# Patient Record
Sex: Female | Born: 1985 | Race: Black or African American | Hispanic: No | Marital: Married | Smoking: Current every day smoker
Health system: Southern US, Community
[De-identification: ages and names within clinical notes are randomized; demographics above are authoritative.]

## PROBLEM LIST (undated history)

## (undated) ENCOUNTER — Inpatient Hospital Stay (HOSPITAL_COMMUNITY): Payer: Self-pay

## (undated) DIAGNOSIS — B009 Herpesviral infection, unspecified: Secondary | ICD-10-CM

## (undated) DIAGNOSIS — T8859XA Other complications of anesthesia, initial encounter: Secondary | ICD-10-CM

## (undated) DIAGNOSIS — T4145XA Adverse effect of unspecified anesthetic, initial encounter: Secondary | ICD-10-CM

## (undated) DIAGNOSIS — Z789 Other specified health status: Secondary | ICD-10-CM

## (undated) HISTORY — DX: Herpesviral infection, unspecified: B00.9

## (undated) HISTORY — DX: Other complications of anesthesia, initial encounter: T88.59XA

---
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
## 2011-06-16 ENCOUNTER — Ambulatory Visit (INDEPENDENT_AMBULATORY_CARE_PROVIDER_SITE_OTHER): Payer: BC Managed Care – PPO

## 2011-06-16 DIAGNOSIS — B9789 Other viral agents as the cause of diseases classified elsewhere: Secondary | ICD-10-CM

## 2011-06-16 DIAGNOSIS — J029 Acute pharyngitis, unspecified: Secondary | ICD-10-CM

## 2011-08-07 ENCOUNTER — Ambulatory Visit (INDEPENDENT_AMBULATORY_CARE_PROVIDER_SITE_OTHER): Payer: BC Managed Care – PPO | Admitting: Physician Assistant

## 2011-08-07 VITALS — BP 121/84 | HR 85 | Temp 98.2°F | Resp 18 | Ht 62.5 in | Wt 214.0 lb

## 2011-08-07 DIAGNOSIS — M545 Low back pain: Secondary | ICD-10-CM

## 2011-08-07 MED ORDER — MELOXICAM 15 MG PO TABS: 15.0000 mg | ORAL_TABLET | Freq: Every day | ORAL | Status: AC

## 2011-08-07 NOTE — Patient Instructions (Signed)
Take medication as directed.  Use "donut" when seated Return if symptoms worsen or no improvement 3 days.    Sciatica Sciatica is a weakness and/or changes in sensation (tingling, jolts, hot and cold, numbness) along the path the sciatic nerve travels. Irritation or damage to lumbar nerve roots is often also referred to as lumbar radiculopathy.  Lumbar radiculopathy (Sciatica) is the most common form of this problem. Radiculopathy can occur in any of the nerves coming out of the spinal cord. The problems caused depend on which nerves are involved. The sciatic nerve is the large nerve supplying the branches of nerves going from the hip to the toes. It often causes a numbness or weakness in the skin and/or muscles that the sciatic nerve serves. It also may cause symptoms (problems) of pain, burning, tingling, or electric shock-like feelings in the path of this nerve. This usually comes from injury to the fibers that make up the sciatic nerve. Some of these symptoms are low back pain and/or unpleasant feelings in the following areas:  From the mid-buttock down the back of the leg to the back of the knee.   And/or the outside of the calf and top of the foot.   And/or behind the inner ankle to the sole of the foot.  CAUSES   Herniated or slipped disc. Discs are the little cushions between the bones in the back.   Pressure by the piriformis muscle in the buttock on the sciatic nerve (Piriformis Syndrome).   Misalignment of the bones in the lower back and buttocks (Sacroiliac Joint Derangement).   Narrowing of the spinal canal that puts pressure on or pinches the fibers that make up the sciatic nerve.   A slipped vertebra that is out of line with those above or beneath it.   Abnormality of the nervous system itself so that nerve fibers do not transmit signals properly, especially to feet and calves (neuropathy).   Tumor (this is rare).  Your caregiver can usually determine the cause of your  sciatica and begin the treatment most likely to help you. TREATMENT  Taking over-the-counter painkillers, physical therapy, rest, exercise, spinal manipulation, and injections of anesthetics and/or steroids may be used. Surgery, acupuncture, and Yoga can also be effective. Mind over matter techniques, mental imagery, and changing factors such as your bed, chair, desk height, posture, and activities are other treatments that may be helpful. You and your caregiver can help determine what is best for you. With proper diagnosis, the cause of most sciatica can be identified and removed. Communication and cooperation between your caregiver and you is essential. If you are not successful immediately, do not be discouraged. With time, a proper treatment can be found that will make you comfortable. HOME CARE INSTRUCTIONS   If the pain is coming from a problem in the back, applying ice to that area for 15 to 20 minutes, 3 to 4 times per day while awake, may be helpful. Put the ice in a plastic bag. Place a towel between the bag of ice and your skin.   You may exercise or perform your usual activities if these do not aggravate your pain, or as suggested by your caregiver.   Only take over-the-counter or prescription medicines for pain, discomfort, or fever as directed by your caregiver.   If your caregiver has given you a follow-up appointment, it is very important to keep that appointment. Not keeping the appointment could result in a chronic or permanent injury, pain, and disability. If  there is any problem keeping the appointment, you must call back to this facility for assistance.  SEEK IMMEDIATE MEDICAL CARE IF:   You experience loss of control of bowel or bladder.   You have increasing weakness in the trunk, buttocks, or legs.   There is numbness in any areas from the hip down to the toes.   You have difficulty walking or keeping your balance.   You have any of the above, with fever or forceful  vomiting.  Document Released: 06/01/2001 Document Revised: 02/17/2011 Document Reviewed: 01/19/2008 [facility] Patient Information 2012 [place], [place].

## 2011-08-07 NOTE — Progress Notes (Signed)
  Subjective:    Patient ID: Kayla [name], female    DOB: 1985/10/19, 26 y.o.   MRN: [id number]  HPI Kayla [name] presents with low back pain, left leg pain and tingling last night while walking at work. Symptoms radiated from low back to foot. No injury. Works 12 hr shifts and walks most of the shift. No recent prolonged sitting.  Took at [facility] with relief.  Pain only present today back of left thigh. No tingling. No history of these symptoms in past.     Review of Systems  Constitutional: Negative.   Musculoskeletal: Positive for back pain.  Neurological: Positive for numbness. Negative for weakness.       Tingling       Objective:   Physical Exam  Constitutional: She appears well-developed and well-nourished.  Musculoskeletal:       Lumbar back: She exhibits pain. She exhibits normal range of motion, no tenderness and no spasm.       Back:  Neurological:  Reflex Scores:      Patellar reflexes are 1+ on the right side and 1+ on the left side.      Achilles reflexes are 1+ on the right side and 1+ on the left side.      Negative straight leg raise          Assessment & Plan:  Low back pain Paraesthesias Sciatic nerve irritation  Mobic 15 mg qd. Heat to area of pain. Donut for comfort while seated. Pt request out of work note for 2 days. Return if no improvement 3 days.

## 2011-08-10 ENCOUNTER — Emergency Department (INDEPENDENT_AMBULATORY_CARE_PROVIDER_SITE_OTHER)
Admission: EM | Admit: 2011-08-10 | Discharge: 2011-08-10 | Disposition: A | Discharge status: Home Health | Payer: BC Managed Care – PPO | Source: Home / Self Care | Attending: Emergency Medicine | Admitting: Emergency Medicine

## 2011-08-10 ENCOUNTER — Encounter (HOSPITAL_COMMUNITY): Payer: Self-pay | Admitting: Emergency Medicine

## 2011-08-10 DIAGNOSIS — J069 Acute upper respiratory infection, unspecified: Secondary | ICD-10-CM

## 2011-08-10 DIAGNOSIS — R05 Cough: Secondary | ICD-10-CM

## 2011-08-10 MED ORDER — GUAIFENESIN-CODEINE 100-10 MG/5ML PO SYRP: 5.0000 mL | ORAL_SOLUTION | Freq: Three times a day (TID) | ORAL | Status: AC | PRN

## 2011-08-10 MED ORDER — ALBUTEROL SULFATE HFA 108 (90 BASE) MCG/ACT IN AERS: 1.0000 | INHALATION_SPRAY | Freq: Four times a day (QID) | RESPIRATORY_TRACT | Status: DC | PRN

## 2011-08-10 NOTE — ED Provider Notes (Signed)
History     CSN: [id number]  Arrival date & time 08/10/11  4098   First MD Initiated Contact with Patient 08/10/11 [phone]      Chief Complaint  Patient presents with  . Bronchitis  . Cough    (Consider location/radiation/quality/duration/timing/severity/associated sxs/prior treatment) HPI Comments: Patient presents urgent care complaining of a barking cough mainly dry but occasional clear phlegm body aches and chest congestion. "It hurts when I cough feels like a burning sensation in my chest" been taking some Tylenol. Think I might have had a fever last night not certain.  Patient is a 26 y.o. female presenting with cough. The history is provided by the patient.  Cough This is a new problem. The current episode started 2 days ago. The problem occurs every few minutes. The problem has been gradually worsening. The cough is non-productive. The maximum temperature recorded prior to her arrival was 100 to 100.9 F. Associated symptoms include chills, rhinorrhea, sore throat and wheezing. Pertinent negatives include no chest pain, no headaches and no shortness of breath. She has tried decongestants for the symptoms. She is a smoker. Her past medical history does not include pneumonia or asthma.    History reviewed. No pertinent past medical history.  History reviewed. No pertinent past surgical history.  No family history on file.  History  Substance Use Topics  . Smoking status: Current Some Day Smoker  . Smokeless tobacco: Not on file  . Alcohol Use: Yes    OB History    Grav Para Term Preterm Abortions TAB SAB Ect Mult Living                  Review of Systems  Constitutional: Positive for fever, chills and appetite change.  HENT: Positive for sore throat and rhinorrhea.   Respiratory: Positive for cough, chest tightness and wheezing. Negative for shortness of breath.   Cardiovascular: Negative for chest pain and palpitations.  Musculoskeletal: Positive for arthralgias.    Skin: Negative for rash.  Neurological: Negative for weakness and headaches.    Allergies  Review of patient's allergies indicates no known allergies.  Home Medications   Current Outpatient Rx  Name Route Sig Dispense Refill  . ALBUTEROL SULFATE HFA 108 (90 BASE) MCG/ACT IN AERS Inhalation Inhale 1-2 puffs into the lungs every 6 (six) hours as needed for wheezing. 1 Inhaler 0  . GUAIFENESIN-CODEINE 100-10 MG/5ML PO SYRP Oral Take 5 mLs by mouth 3 (three) times daily as needed for cough. 120 mL 0  . MELOXICAM 15 MG PO TABS Oral Take 1 tablet (15 mg total) by mouth daily. 30 tablet 0    BP 132/82  Pulse 106  Temp(Src) 98.9 F (37.2 C) (Oral)  Resp 18  SpO2 96%  LMP 07/19/2011  Physical Exam  Nursing note and vitals reviewed. Constitutional: She appears well-developed. She is active.  Non-toxic appearance. She does not have a sickly appearance. She does not appear ill. No distress.  HENT:  Head: Normocephalic.  Right Ear: Tympanic membrane normal.  Left Ear: Tympanic membrane normal.  Mouth/Throat: Uvula is midline and mucous membranes are normal. Posterior oropharyngeal erythema present. No tonsillar abscesses.  Eyes: Conjunctivae are normal.  Neck: Normal range of motion. Neck supple.  Pulmonary/Chest: Effort normal. No respiratory distress. She has no decreased breath sounds. She has wheezes. She has no rhonchi. She has no rales.    Abdominal: Soft.  Neurological: She is alert.  Skin: Skin is warm.    ED Course  Procedures (  including critical care time)  Labs Reviewed - No data to display No results found.   1. Upper respiratory infection   2. Cough       MDM  Upper Torri infection with mild reactive airway disease symptomatic treatment.        Jimmie Molly, MD 08/10/11 [phone]

## 2011-08-10 NOTE — ED Notes (Signed)
HERE WITH CONSTANT BARKING COUGH AND CLEAR PHLEGM,BODY ACHES AND CHEST CONGESTION AND PAIN THAT STARTED YESTERDAY MORNING.PT [facility] BEEN TAKING OTC TYLENOL COLD BUT NO RELIEF.NO FEVER,N,V.

## 2011-08-10 NOTE — Discharge Instructions (Signed)
Antibiotic Nonuse  Your caregiver felt that the infection or problem was not one that would be helped with an antibiotic. Infections may be caused by viruses or bacteria. Only a caregiver can tell which one of these is the likely cause of an illness. A cold is the most common cause of infection in both adults and children. A cold is a virus. Antibiotic treatment will have no effect on a viral infection. Viruses can lead to many lost days of work caring for sick children and many missed days of school. Children may catch as many as 10 "colds" or "flus" per year during which they can be tearful, cranky, and uncomfortable. The goal of treating a virus is aimed at keeping the ill person comfortable. Antibiotics are medications used to help the body fight bacterial infections. There are relatively few types of bacteria that cause infections but there are hundreds of viruses. While both viruses and bacteria cause infection they are very different types of germs. A viral infection will typically go away by itself within 7 to 10 days. Bacterial infections may spread or get worse without antibiotic treatment. Examples of bacterial infections are:  Sore throats (like strep throat or tonsillitis).     Infection in the lung (pneumonia).     Ear and skin infections.  Examples of viral infections are:  Colds or flus.     Most coughs and bronchitis.     Sore throats not caused by Strep.     Runny noses.  It is often best not to take an antibiotic when a viral infection is the cause of the problem. Antibiotics can kill off the helpful bacteria that we have inside our body and allow harmful bacteria to start growing. Antibiotics can cause side effects such as allergies, nausea, and diarrhea without helping to improve the symptoms of the viral infection. Additionally, repeated uses of antibiotics can cause bacteria inside of our body to become resistant. That resistance can be passed onto harmful bacterial. The  next time you have an infection it may be harder to treat if antibiotics are used when they are not needed. Not treating with antibiotics allows our own immune system to develop and take care of infections more efficiently. Also, antibiotics will work better for us when they are prescribed for bacterial infections. Treatments for a child that is ill may include:  Give extra fluids throughout the day to stay hydrated.     Get plenty of rest.     Only give your child over-the-counter or prescription medicines for pain, discomfort, or fever as directed by your caregiver.     The use of a cool mist humidifier may help stuffy noses.     Cold medications if suggested by your caregiver.  Your caregiver may decide to start you on an antibiotic if:  The problem you were seen for today continues for a longer length of time than expected.     You develop a secondary bacterial infection.  SEEK MEDICAL CARE IF:  Fever lasts longer than 5 days.     Symptoms continue to get worse after 5 to 7 days or become severe.     Difficulty in breathing develops.     Signs of dehydration develop (poor drinking, rare urinating, dark colored urine).     Changes in behavior or worsening tiredness (listlessness or lethargy).  Document Released: 08/16/2001 Document Revised: 02/17/2011 Document Reviewed: 02/12/2009 [employer] Patient Information 2012 [employer]. 

## 2014-09-18 ENCOUNTER — Ambulatory Visit: Payer: BC Managed Care – PPO | Attending: Internal Medicine

## 2015-04-24 ENCOUNTER — Encounter: Payer: Self-pay | Admitting: Family Medicine

## 2015-04-24 ENCOUNTER — Ambulatory Visit (INDEPENDENT_AMBULATORY_CARE_PROVIDER_SITE_OTHER): Payer: BLUE CROSS/BLUE SHIELD | Admitting: Family Medicine

## 2015-04-24 VITALS — BP 110/80 | HR 92 | Temp 98.9°F | Resp 16 | Ht 62.5 in | Wt 230.2 lb

## 2015-04-24 DIAGNOSIS — Z0000 Encounter for general adult medical examination without abnormal findings: Secondary | ICD-10-CM

## 2015-04-24 DIAGNOSIS — Z1321 Encounter for screening for nutritional disorder: Secondary | ICD-10-CM

## 2015-04-24 DIAGNOSIS — Z136 Encounter for screening for cardiovascular disorders: Secondary | ICD-10-CM

## 2015-04-24 DIAGNOSIS — E559 Vitamin D deficiency, unspecified: Secondary | ICD-10-CM | POA: Diagnosis not present

## 2015-04-24 DIAGNOSIS — Z1389 Encounter for screening for other disorder: Secondary | ICD-10-CM | POA: Diagnosis not present

## 2015-04-24 DIAGNOSIS — Z130 Encounter for screening for diseases of the blood and blood-forming organs and certain disorders involving the immune mechanism: Secondary | ICD-10-CM

## 2015-04-24 DIAGNOSIS — Z1383 Encounter for screening for respiratory disorder NEC: Secondary | ICD-10-CM

## 2015-04-24 DIAGNOSIS — B009 Herpesviral infection, unspecified: Secondary | ICD-10-CM

## 2015-04-24 DIAGNOSIS — Z1329 Encounter for screening for other suspected endocrine disorder: Secondary | ICD-10-CM | POA: Diagnosis not present

## 2015-04-24 DIAGNOSIS — Z113 Encounter for screening for infections with a predominantly sexual mode of transmission: Secondary | ICD-10-CM

## 2015-04-24 LAB — CBC
HEMATOCRIT: 39.5 % (ref 36.0–46.0)
HEMOGLOBIN: 13.8 g/dL (ref 12.0–15.0)
MCH: 27.5 pg (ref 26.0–34.0)
MCHC: 34.9 g/dL (ref 30.0–36.0)
MCV: 78.7 fL (ref 78.0–100.0)
MPV: 8.8 fL (ref 8.6–12.4)
PLATELETS: 297 10*3/uL (ref 150–400)
RBC: 5.02 MIL/uL (ref 3.87–5.11)
RDW: 14.8 % (ref 11.5–15.5)
WBC: 5.8 10*3/uL (ref 4.0–10.5)

## 2015-04-24 LAB — LIPID PANEL
CHOL/HDL RATIO: 2.5 ratio (ref <=5.0)
CHOLESTEROL: 186 mg/dL (ref 125–200)
HDL: 75 mg/dL (ref >=46)
LDL Cholesterol: 94 mg/dL (ref <130)
TRIGLYCERIDES: 86 mg/dL (ref <150)
VLDL: 17 mg/dL (ref <30)

## 2015-04-24 LAB — COMPREHENSIVE METABOLIC PANEL
ALBUMIN: 4.5 g/dL (ref 3.6–5.1)
ALK PHOS: 49 U/L (ref 33–115)
ALT: 15 U/L (ref 6–29)
AST: 18 U/L (ref 10–30)
BUN: 11 mg/dL (ref 7–25)
CALCIUM: 9.4 mg/dL (ref 8.6–10.2)
CHLORIDE: 106 mmol/L (ref 98–110)
CO2: 22 mmol/L (ref 20–31)
Creat: 0.67 mg/dL (ref 0.50–1.10)
Glucose, Bld: 84 mg/dL (ref 65–99)
POTASSIUM: 4.4 mmol/L (ref 3.5–5.3)
Sodium: 135 mmol/L (ref 135–146)
TOTAL PROTEIN: 7 g/dL (ref 6.1–8.1)
Total Bilirubin: 0.5 mg/dL (ref 0.2–1.2)

## 2015-04-24 LAB — TSH: TSH: 1.291 u[IU]/mL (ref 0.350–4.500)

## 2015-04-24 NOTE — Progress Notes (Signed)
Subjective:    Patient ID: Kayla [name], female    DOB: 08/22/1985, 29 y.o.   MRN: [id number] Chief Complaint  Patient presents with  . Annual Exam    W/O PAP, Gyn appt next week 05/02/15    HPI  G2P0020.  Had elective abortion x 2.  She is in a lnog-term relationship for 5 yrs and not wanting.  Her cycles are every 25 days, no dysmenorrhea or menorrheagia.  Has not been on birth control prior. Uses condoms occ  Is smoking with no desire to quit at this time.  Is not taking vit - will start pnv.   Exercise intermittently but giong to restart - she would like to loose 30 to 60 lbs. She has never lost a sig amount of weight before but has never tried.  She did loose 15 lbs as she was drinking a lot of water, not eating at night, healthy breakfast.  Gets lots of water.  Maternal GF diagnosed and some other in family.   Last pap was 2014 and [facility]  History reviewed. No pertinent past medical history. History reviewed. No pertinent past surgical history. Current Outpatient Prescriptions on File Prior to Visit  Medication Sig Dispense Refill  . albuterol (PROVENTIL HFA;VENTOLIN HFA) 108 (90 BASE) MCG/ACT inhaler Inhale 1-2 puffs into the lungs every 6 (six) hours as needed for wheezing. 1 Inhaler 0   No current facility-administered medications on file prior to visit.   No Known Allergies Family History  Problem Relation Age of Onset  . Cancer Maternal Grandmother     leukemia  . Diabetes Maternal Grandmother   . Hyperlipidemia Maternal Grandfather   . Heart disease Paternal Grandmother    Social History   Social History  . Marital Status: Single    Spouse Name: N/A  . Number of Children: N/A  . Years of Education: N/A   Occupational History  . Customer Care Analyst    Social History Main Topics  . Smoking status: Current Some Day Smoker  . Smokeless tobacco: None  . Alcohol Use: 1.8 oz/week    3 Standard drinks or equivalent per week    . Drug Use: No  . Sexual Activity: Yes   Other Topics Concern  . None   Social History Narrative   Single. Education: [employer]. Exercise: Yes.     Review of Systems  Constitutional: Negative.   HENT: Positive for dental problem.   Eyes: Negative.   Respiratory: Negative.   Cardiovascular: Negative.   Gastrointestinal: Negative.   Endocrine: Negative.   Genitourinary: Negative.   Musculoskeletal: Negative.   Skin: Negative.   Allergic/Immunologic: Negative.   Neurological: Negative.   Hematological: Negative.   Psychiatric/Behavioral: Negative.        Objective:  BP 110/80 mmHg  Pulse 92  Temp(Src) 98.9 F (37.2 C) (Oral)  Resp 16  Ht 5' 2.5" (1.588 m)  Wt 230 lb 3.2 oz (104.418 kg)  BMI 41.41 kg/m2  SpO2 97%  LMP 04/03/2015  Physical Exam  Constitutional: She is oriented to person, place, and time. She appears well-developed and well-nourished. No distress.  HENT:  Head: Normocephalic and atraumatic.  Right Ear: Tympanic membrane, external ear and ear canal normal.  Left Ear: Tympanic membrane, external ear and ear canal normal.  Nose: Nose normal. No mucosal edema or rhinorrhea.  Mouth/Throat: Uvula is midline, oropharynx is clear and moist and mucous membranes are normal. No posterior oropharyngeal erythema.  Eyes: Conjunctivae  and EOM are normal. Pupils are equal, round, and reactive to light. Right eye exhibits no discharge. Left eye exhibits no discharge. No scleral icterus.  Neck: Normal range of motion. Neck supple. No thyromegaly present.  Cardiovascular: Normal rate, regular rhythm, normal heart sounds and intact distal pulses.   Pulmonary/Chest: Effort normal and breath sounds normal. No respiratory distress.  Abdominal: Soft. Bowel sounds are normal. There is no tenderness.  Musculoskeletal: She exhibits no edema.  Lymphadenopathy:    She has no cervical adenopathy.  Neurological: She is alert and oriented to person, place, and time. She has normal  reflexes.  Skin: Skin is warm and dry. She is not diaphoretic. No erythema.  Psychiatric: She has a normal mood and affect. Her behavior is normal.          Assessment & Plan:   1. Annual physical exam   2. Routine screening for STI (sexually transmitted infection)   3. Screening for cardiovascular, respiratory, and genitourinary diseases   4. Screening for deficiency anemia   5. Screening for thyroid disorder   6. Encounter for vitamin deficiency screening   7. Herpes simplex infection   8. Vitamin D deficiency     Orders Placed This Encounter  Procedures  . CBC  . Comprehensive metabolic panel    Order Specific Question:  Has the patient fasted?    Answer:  Yes  . Lipid panel    Order Specific Question:  Has the patient fasted?    Answer:  Yes  . Vit D  25 hydroxy (rtn osteoporosis monitoring)  . TSH    Meds ordered this encounter  Medications  . Vitamin D, Ergocalciferol, (DRISDOL) 50000 UNITS CAPS capsule    Sig: Take 1 capsule (50,000 Units total) by mouth every 7 (seven) days.    Dispense:  12 capsule    Refill:  1     Norberto Sorenson, MD MPH

## 2015-04-25 LAB — VITAMIN D 25 HYDROXY (VIT D DEFICIENCY, FRACTURES): Vit D, 25-Hydroxy: 12 ng/mL — ABNORMAL LOW (ref 30–100)

## 2015-04-26 ENCOUNTER — Encounter: Payer: Self-pay | Admitting: Family Medicine

## 2015-04-26 MED ORDER — VITAMIN D (ERGOCALCIFEROL) 1.25 MG (50000 UNIT) PO CAPS: 50000.0000 [IU] | ORAL_CAPSULE | ORAL | Status: DC

## 2015-04-28 ENCOUNTER — Other Ambulatory Visit: Payer: Self-pay | Admitting: Obstetrics

## 2015-04-29 LAB — CYTOLOGY - PAP

## 2015-05-24 MED ORDER — VALACYCLOVIR HCL 1 G PO TABS: 1000.0000 mg | ORAL_TABLET | Freq: Two times a day (BID) | ORAL | Status: DC

## 2015-05-24 MED ORDER — PRENATAL PLUS 27-1 MG PO TABS: 1.0000 | ORAL_TABLET | Freq: Every day | ORAL | Status: DC

## 2016-03-25 ENCOUNTER — Inpatient Hospital Stay (HOSPITAL_COMMUNITY)
Admission: AD | Admit: 2016-03-25 | Discharge: 2016-03-25 | Disposition: A | Discharge status: Home / Self Care | Payer: 59 | Source: Ambulatory Visit | Attending: Family Medicine | Admitting: Family Medicine

## 2016-03-25 ENCOUNTER — Encounter (HOSPITAL_COMMUNITY): Payer: Self-pay | Admitting: *Deleted

## 2016-03-25 ENCOUNTER — Inpatient Hospital Stay (HOSPITAL_COMMUNITY): Payer: 59

## 2016-03-25 DIAGNOSIS — Z3A01 Less than 8 weeks gestation of pregnancy: Secondary | ICD-10-CM | POA: Diagnosis not present

## 2016-03-25 DIAGNOSIS — R1030 Lower abdominal pain, unspecified: Secondary | ICD-10-CM | POA: Diagnosis not present

## 2016-03-25 DIAGNOSIS — Z3490 Encounter for supervision of normal pregnancy, unspecified, unspecified trimester: Secondary | ICD-10-CM

## 2016-03-25 DIAGNOSIS — O26899 Other specified pregnancy related conditions, unspecified trimester: Secondary | ICD-10-CM

## 2016-03-25 DIAGNOSIS — O26891 Other specified pregnancy related conditions, first trimester: Secondary | ICD-10-CM | POA: Diagnosis not present

## 2016-03-25 DIAGNOSIS — R109 Unspecified abdominal pain: Secondary | ICD-10-CM | POA: Diagnosis not present

## 2016-03-25 DIAGNOSIS — Z87891 Personal history of nicotine dependence: Secondary | ICD-10-CM | POA: Diagnosis not present

## 2016-03-25 LAB — URINALYSIS, ROUTINE W REFLEX MICROSCOPIC
BILIRUBIN URINE: NEGATIVE
Glucose, UA: NEGATIVE mg/dL
Hgb urine dipstick: NEGATIVE
KETONES UR: 15 mg/dL — AB
Leukocytes, UA: NEGATIVE
NITRITE: NEGATIVE
PROTEIN: NEGATIVE mg/dL
SPECIFIC GRAVITY, URINE: 1.025 (ref 1.005–1.030)
pH: 6.5 (ref 5.0–8.0)

## 2016-03-25 LAB — CBC
HEMATOCRIT: 34.5 % — AB (ref 36.0–46.0)
HEMOGLOBIN: 12.3 g/dL (ref 12.0–15.0)
MCH: 27.3 pg (ref 26.0–34.0)
MCHC: 35.7 g/dL (ref 30.0–36.0)
MCV: 76.5 fL — AB (ref 78.0–100.0)
Platelets: 339 10*3/uL (ref 150–400)
RBC: 4.51 MIL/uL (ref 3.87–5.11)
RDW: 13.9 % (ref 11.5–15.5)
WBC: 8 10*3/uL (ref 4.0–10.5)

## 2016-03-25 LAB — WET PREP, GENITAL
Clue Cells Wet Prep HPF POC: NONE SEEN
SPERM: NONE SEEN
TRICH WET PREP: NONE SEEN

## 2016-03-25 LAB — HCG, QUANTITATIVE, PREGNANCY: hCG, Beta Chain, Quant, S: 70927 m[IU]/mL — ABNORMAL HIGH (ref <5)

## 2016-03-25 LAB — POCT PREGNANCY, URINE: Preg Test, Ur: POSITIVE — AB

## 2016-03-25 LAB — OB RESULTS CONSOLE GC/CHLAMYDIA: Gonorrhea: NEGATIVE

## 2016-03-25 NOTE — MAU Note (Signed)
Pt has had lower pain on and off x 2 weeks. Took HPT and it was positive. Denies any vaginal  Bleeding or discharge. C/o breast tenderness as well.

## 2016-03-25 NOTE — Discharge Instructions (Signed)
Prenatal Care Providers °[facility]    [facility] ° Phone- [phone]     Phone: [phone] °         °[facility]                      [facility] ° @[place]     Phone: [phone] ° Phone: [phone] °        [facility] °[facility]     Phone: [phone] ° Phone: [phone]   °        [facility] °[facility] [place]                hone: [phone] ° Phone: [phone] °        [facility] °Dr. Bernard Marshall      Phone: [phone] ° Phone: [phone] °        [facility] °[facility]                Phone: [phone] ° Women’s Health  ° Phone:[phone]    [facility] °         Phone: [phone] °[facility] °  Phone: [phone] °Safe Medications in Pregnancy  ° °Acne: °Benzoyl Peroxide °Salicylic Acid ° °Backache/Headache: °Tylenol: 2 regular strength every 4 hours OR °             2 Extra strength every 6 hours ° °Colds/Coughs/Allergies: °Benadryl (alcohol free) 25 mg every 6 hours as needed °Breath right strips °Claritin °Cepacol throat lozenges °Chloraseptic throat spray °Cold-Eeze- up to three times per day °Cough drops, alcohol free °Flonase (by prescription only) °Guaifenesin °Mucinex °Robitussin DM (plain only, alcohol free) °Saline nasal spray/drops °Sudafed (pseudoephedrine) & Actifed ** use only after [redacted] weeks gestation and if you do not have high blood pressure °Tylenol °Vicks Vaporub °Zinc lozenges °Zyrtec  ° °Constipation: °Colace °Ducolax suppositories °Fleet enema °Glycerin suppositories °Metamucil °Milk of magnesia °Miralax °Senokot °Smooth move tea ° °Diarrhea: °Kaopectate °Imodium A-D ° °*NO pepto Bismol ° °Hemorrhoids: °Anusol °Anusol HC °Preparation H °Tucks ° °Indigestion: °Tums °Maalox °Mylanta °Zantac  °Pepcid ° °Insomnia: °Benadryl (alcohol free) 25mg every 6 hours as needed °Tylenol  PM °Unisom, no Gelcaps ° °Leg Cramps: °Tums °MagGel ° °Nausea/Vomiting:  °Bonine °Dramamine °Emetrol °Ginger extract °Sea bands °Meclizine  °Nausea medication to take during pregnancy:  °Unisom (doxylamine succinate 25 mg tablets) Take one tablet daily at bedtime. If symptoms are not adequately controlled, the dose can be increased to a maximum recommended dose of two tablets daily (1/2 tablet in the morning, 1/2 tablet mid-afternoon and one at bedtime). °Vitamin B6 100mg tablets. Take one tablet twice a day (up to 200 mg per day). ° °Skin Rashes: °Aveeno products °Benadryl cream or 25mg every 6 hours as needed °Calamine Lotion °1% cortisone cream ° °Yeast infection: °Gyne-lotrimin 7 °Monistat 7 ° ° °**If taking multiple medications, please check labels to avoid duplicating the same active ingredients °**take medication as directed on the label °** Do not exceed 4000 mg of tylenol in 24 hours °**Do not take medications that contain aspirin or ibuprofen ° ° ° ° °First Trimester of Pregnancy °The first trimester of pregnancy is from week 1 until the end of week 12 (months 1 through 3). A week after a sperm fertilizes an egg, the egg will implant on the wall of the uterus. This   embryo will begin to develop into a baby. Genes from you and your partner are forming the baby. The female genes determine whether the baby is a boy or a girl. At 6-8 weeks, the eyes and face are formed, and the heartbeat can be seen on ultrasound. At the end of 12 weeks, all the baby's organs are formed.  °Now that you are pregnant, you will want to do everything you can to have a healthy baby. Two of the most important things are to get good prenatal care and to follow your health care provider's instructions. Prenatal care is all the medical care you receive before the baby's birth. This care will help prevent, find, and treat any problems during the pregnancy and childbirth. °BODY CHANGES °Your body goes through many changes during pregnancy.  The changes vary from woman to woman.  °· You may gain or lose a couple of pounds at first. °· You may feel sick to your stomach (nauseous) and throw up (vomit). If the vomiting is uncontrollable, call your health care provider. °· You may tire easily. °· You may develop headaches that can be relieved by medicines approved by your health care provider. °· You may urinate more often. Painful urination may mean you have a bladder infection. °· You may develop heartburn as a result of your pregnancy. °· You may develop constipation because certain hormones are causing the muscles that push waste through your intestines to slow down. °· You may develop hemorrhoids or swollen, bulging veins (varicose veins). °· Your breasts may begin to grow larger and become tender. Your nipples may stick out more, and the tissue that surrounds them (areola) may become darker. °· Your gums may bleed and may be sensitive to brushing and flossing. °· Dark spots or blotches (chloasma, mask of pregnancy) may develop on your face. This will likely fade after the baby is born. °· Your menstrual periods will stop. °· You may have a loss of appetite. °· You may develop cravings for certain kinds of food. °· You may have changes in your emotions from day to day, such as being excited to be pregnant or being concerned that something may go wrong with the pregnancy and baby. °· You may have more vivid and strange dreams. °· You may have changes in your hair. These can include thickening of your hair, rapid growth, and changes in texture. Some women also have hair loss during or after pregnancy, or hair that feels dry or thin. Your hair will most likely return to normal after your baby is born. °WHAT TO EXPECT AT YOUR PRENATAL VISITS °During a routine prenatal visit: °· You will be weighed to make sure you and the baby are growing normally. °· Your blood pressure will be taken. °· Your abdomen will be measured to track your baby's growth. °· The  fetal heartbeat will be listened to starting around week 10 or 12 of your pregnancy. °· Test results from any previous visits will be discussed. °Your health care provider may ask you: °· How you are feeling. °· If you are feeling the baby move. °· If you have had any abnormal symptoms, such as leaking fluid, bleeding, severe headaches, or abdominal cramping. °· If you are using any tobacco products, including cigarettes, chewing tobacco, and electronic cigarettes. °· If you have any questions. °Other tests that may be performed during your first trimester include: °· Blood tests to find your blood type and to check for the presence of any previous   infections. They will also be used to check for low iron levels (anemia) and Rh antibodies. Later in the pregnancy, blood tests for diabetes will be done along with other tests if problems develop. °· Urine tests to check for infections, diabetes, or protein in the urine. °· An ultrasound to confirm the proper growth and development of the baby. °· An amniocentesis to check for possible genetic problems. °· Fetal screens for spina bifida and Down syndrome. °· You may need other tests to make sure you and the baby are doing well. °· HIV (human immunodeficiency virus) testing. Routine prenatal testing includes screening for HIV, unless you choose not to have this test. °HOME CARE INSTRUCTIONS  °Medicines °· Follow your health care provider's instructions regarding medicine use. Specific medicines may be either safe or unsafe to take during pregnancy. °· Take your prenatal vitamins as directed. °· If you develop constipation, try taking a stool softener if your health care provider approves. °Diet °· Eat regular, well-balanced meals. Choose a variety of foods, such as meat or vegetable-based protein, fish, milk and low-fat dairy products, vegetables, fruits, and whole grain breads and cereals. Your health care provider will help you determine the amount of weight gain that  is right for you. °· Avoid raw meat and uncooked cheese. These carry germs that can cause birth defects in the baby. °· Eating four or five small meals rather than three large meals a day may help relieve nausea and vomiting. If you start to feel nauseous, eating a few soda crackers can be helpful. Drinking liquids between meals instead of during meals also seems to help nausea and vomiting. °· If you develop constipation, eat more high-fiber foods, such as fresh vegetables or fruit and whole grains. Drink enough fluids to keep your urine clear or pale yellow. °Activity and Exercise °· Exercise only as directed by your health care provider. Exercising will help you: °¨ Control your weight. °¨ Stay in shape. °¨ Be prepared for labor and delivery. °· Experiencing pain or cramping in the lower abdomen or low back is a good sign that you should stop exercising. Check with your health care provider before continuing normal exercises. °· Try to avoid standing for long periods of time. Move your legs often if you must stand in one place for a long time. °· Avoid heavy lifting. °· Wear low-heeled shoes, and practice good posture. °· You may continue to have sex unless your health care provider directs you otherwise. °Relief of Pain or Discomfort °· Wear a good support bra for breast tenderness.   °· Take warm sitz baths to soothe any pain or discomfort caused by hemorrhoids. Use hemorrhoid cream if your health care provider approves.   °· Rest with your legs elevated if you have leg cramps or low back pain. °· If you develop varicose veins in your legs, wear support hose. Elevate your feet for 15 minutes, 3-4 times a day. Limit salt in your diet. °Prenatal Care °· Schedule your prenatal visits by the twelfth week of pregnancy. They are usually scheduled monthly at first, then more often in the last 2 months before delivery. °· Write down your questions. Take them to your prenatal visits. °· Keep all your prenatal visits as  directed by your health care provider. °Safety °· Wear your seat belt at all times when driving. °· Make a list of emergency phone numbers, including numbers for family, friends, the hospital, and police and fire departments. °General Tips °· Ask your health care   provider for a referral to a local prenatal education class. Begin classes no later than at the beginning of month 6 of your pregnancy. °· Ask for help if you have counseling or nutritional needs during pregnancy. Your health care provider can offer advice or refer you to specialists for help with various needs. °· Do not use hot tubs, steam rooms, or saunas. °· Do not douche or use tampons or scented sanitary pads. °· Do not cross your legs for long periods of time. °· Avoid cat litter boxes and soil used by cats. These carry germs that can cause birth defects in the baby and possibly loss of the fetus by miscarriage or stillbirth. °· Avoid all smoking, herbs, alcohol, and medicines not prescribed by your health care provider. Chemicals in these affect the formation and growth of the baby. °· Do not use any tobacco products, including cigarettes, chewing tobacco, and electronic cigarettes. If you need help quitting, ask your health care provider. You may receive counseling support and other resources to help you quit. °· Schedule a dentist appointment. At home, brush your teeth with a soft toothbrush and be gentle when you floss. °SEEK MEDICAL CARE IF:  °· You have dizziness. °· You have mild pelvic cramps, pelvic pressure, or nagging pain in the abdominal area. °· You have persistent nausea, vomiting, or diarrhea. °· You have a bad smelling vaginal discharge. °· You have pain with urination. °· You notice increased swelling in your face, hands, legs, or ankles. °SEEK IMMEDIATE MEDICAL CARE IF:  °· You have a fever. °· You are leaking fluid from your vagina. °· You have spotting or bleeding from your vagina. °· You have severe abdominal cramping or  pain. °· You have rapid weight gain or loss. °· You vomit blood or material that looks like coffee grounds. °· You are exposed to German measles and have never had them. °· You are exposed to fifth disease or chickenpox. °· You develop a severe headache. °· You have shortness of breath. °· You have any kind of trauma, such as from a fall or a car accident. °  °This information is not intended to replace advice given to you by your health care provider. Make sure you discuss any questions you have with your health care provider. °  °Document Released: 06/01/2001 Document Revised: 06/28/2014 Document Reviewed: 04/17/2013 °Elsevier Interactive Patient Education ©2016 [employer] ° °

## 2016-03-25 NOTE — MAU Provider Note (Signed)
History     CSN: [id number]  Arrival date and time: 03/25/16 1702   First Provider Initiated Contact with Patient 03/25/16 1854     Chief Complaint  Patient presents with  . Abdominal Pain   HPI Kayla [name] is a 30 y.o. G3P0020 at [redacted]w[redacted]d who presents with abdominal cramping. Reports daily lower abdominal cramping intermittently for the last few weeks. Initially thought the cramping was related to her period but then had a positive pregnancy test. Last felt pain this morning. Denies vaginal bleeding, vaginal discharge, dysuria, vomiting, diarrhea, or constipation. No recent intercourse.   OB History    Gravida Para Term Preterm AB Living   3 0     2     SAB TAB Ectopic Multiple Live Births     2            History reviewed. No pertinent past medical history.  History reviewed. No pertinent surgical history.  Family History  Problem Relation Age of Onset  . Cancer Maternal Grandmother     leukemia  . Diabetes Maternal Grandmother   . Hyperlipidemia Maternal Grandfather   . Heart disease Paternal Grandmother     Social History  Substance Use Topics  . Smoking status: Former Games developer  . Smokeless tobacco: Former Neurosurgeon  . Alcohol use 1.8 oz/week    3 Standard drinks or equivalent per week    Allergies: No Known Allergies  No prescriptions prior to admission.    Review of Systems  Constitutional: Negative.   Gastrointestinal: Positive for abdominal pain and nausea. Negative for constipation, diarrhea and vomiting.  Genitourinary: Negative.    Physical Exam   Blood pressure 124/80, pulse 89, temperature 98.8 F (37.1 C), temperature source Oral, resp. rate 18, height 5\' 2"  (1.575 m), weight 244 lb 6.4 oz (110.9 kg), last menstrual period 02/06/2016.  Physical Exam  Nursing note and vitals reviewed. Constitutional: She is oriented to person, place, and time. She appears well-developed and well-nourished. No distress.  HENT:  Head: Normocephalic and atraumatic.   Eyes: Conjunctivae are normal. Right eye exhibits no discharge. Left eye exhibits no discharge. No scleral icterus.  Neck: Normal range of motion.  Respiratory: Effort normal. No respiratory distress.  GI: Soft. She exhibits no distension. There is no tenderness. There is no rebound and no guarding.  Genitourinary: Uterus normal. Cervix exhibits no motion tenderness. Right adnexum displays no mass and no tenderness. Left adnexum displays no mass and no tenderness.  Genitourinary Comments: Cervix closed  Neurological: She is alert and oriented to person, place, and time.  Skin: Skin is warm and dry. She is not diaphoretic.  Psychiatric: She has a normal mood and affect. Her behavior is normal. Judgment and thought content normal.   Results for orders placed or performed during the hospital encounter of 03/25/16 (from the past 24 hour(s))  Urinalysis, Routine w reflex microscopic (not at [facility])     Status: Abnormal   Collection Time: 03/25/16  6:15 PM  Result Value Ref Range   Color, Urine YELLOW YELLOW   APPearance HAZY (A) CLEAR   Specific Gravity, Urine 1.025 1.005 - 1.030   pH 6.5 5.0 - 8.0   Glucose, UA NEGATIVE NEGATIVE mg/dL   Hgb urine dipstick NEGATIVE NEGATIVE   Bilirubin Urine NEGATIVE NEGATIVE   Ketones, ur 15 (A) NEGATIVE mg/dL   Protein, ur NEGATIVE NEGATIVE mg/dL   Nitrite NEGATIVE NEGATIVE   Leukocytes, UA NEGATIVE NEGATIVE  Pregnancy, urine POC     Status: Abnormal  Collection Time: 03/25/16  6:23 PM  Result Value Ref Range   Preg Test, Ur POSITIVE (A) NEGATIVE  Wet prep, genital     Status: Abnormal   Collection Time: 03/25/16  6:59 PM  Result Value Ref Range   Yeast Wet Prep HPF POC PRESENT (A) NONE SEEN   Trich, Wet Prep NONE SEEN NONE SEEN   Clue Cells Wet Prep HPF POC NONE SEEN NONE SEEN   WBC, Wet Prep HPF POC FEW (A) NONE SEEN   Sperm NONE SEEN   CBC     Status: Abnormal   Collection Time: 03/25/16  7:02 PM  Result Value Ref Range   WBC 8.0 4.0 - 10.5  K/uL   RBC 4.51 3.87 - 5.11 MIL/uL   Hemoglobin 12.3 12.0 - 15.0 g/dL   HCT 16.1 (L) 09.6 - 04.5 %   MCV 76.5 (L) 78.0 - 100.0 fL   MCH 27.3 26.0 - 34.0 pg   MCHC 35.7 30.0 - 36.0 g/dL   RDW 40.9 81.1 - 91.4 %   Platelets 339 150 - 400 K/uL  ABO/Rh     Status: None (Preliminary result)   Collection Time: 03/25/16  7:02 PM  Result Value Ref Range   ABO/RH(D) O POS   hCG, quantitative, pregnancy     Status: Abnormal   Collection Time: 03/25/16  7:02 PM  Result Value Ref Range   hCG, Beta Chain, Quant, S 70,927 (H) <5 mIU/mL   US Ob Comp Less 14 Wks  Result Date: 03/25/2016 CLINICAL DATA:  Intermittent lower abdominal pain for the past 2 weeks. Six weeks and 6 days pregnant by last menstrual period. EXAM: OBSTETRIC <14 WK [place] AND TRANSVAGINAL OB US TECHNIQUE: Both transabdominal and transvaginal ultrasound examinations were performed for complete evaluation of the gestation as well as the maternal uterus, adnexal regions, and pelvic cul-de-sac. Transvaginal technique was performed to assess early pregnancy. COMPARISON:  None. FINDINGS: Intrauterine gestational sac: Visualized Yolk sac:  Visualized Embryo:  Visualized Cardiac Activity: Visualized Heart Rate: 130  bpm CRL:  10  mm   7 w   1 d                  [place] EDC: 11/10/2016 Subchorionic hemorrhage:  None visualized. Maternal uterus/adnexae: 2.7 cm simple right ovarian cyst. Normal appearing left ovary. Trace amount of free peritoneal fluid, within normal limits of physiological fluid. IMPRESSION: Single live intrauterine gestation with an estimated gestational age of [redacted] weeks and 1 day. No complicating features. Electronically Signed   By: Beckie Salts M.D.   On: 03/25/2016 20:38   US Ob Transvaginal  Result Date: 03/25/2016 CLINICAL DATA:  Intermittent lower abdominal pain for the past 2 weeks. Six weeks and 6 days pregnant by last menstrual period. EXAM: OBSTETRIC <14 WK [place] AND TRANSVAGINAL OB US TECHNIQUE: Both transabdominal and transvaginal  ultrasound examinations were performed for complete evaluation of the gestation as well as the maternal uterus, adnexal regions, and pelvic cul-de-sac. Transvaginal technique was performed to assess early pregnancy. COMPARISON:  None. FINDINGS: Intrauterine gestational sac: Visualized Yolk sac:  Visualized Embryo:  Visualized Cardiac Activity: Visualized Heart Rate: 130  bpm CRL:  10  mm   7 w   1 d                  [place] EDC: 11/10/2016 Subchorionic hemorrhage:  None visualized. Maternal uterus/adnexae: 2.7 cm simple right ovarian cyst. Normal appearing left ovary. Trace amount of free peritoneal fluid, within  normal limits of physiological fluid. IMPRESSION: Single live intrauterine gestation with an estimated gestational age of [redacted] weeks and 1 day. No complicating features. Electronically Signed   By: Beckie SaltsSteven  Reid M.D.   On: 03/25/2016 20:38   MAU Course  Procedures  MDM +UPT UA, wet prep, GC/chlamydia, CBC, ABO/Rh, quant hCG, HIV, and US today to rule out ectopic pregnancy [facility]      Judeth HornErin Lawrence, NP 03/25/2016 8:02 PM   Assessment and Plan   1. Intrauterine pregnancy   2. Abdominal pain affecting pregnancy    DC home Comfort measures reviewed  1st Trimester precautions  Bleeding precautions RX: none  Return to MAU as needed FU with OB as planned  Follow-up Information    [facility]   Contact information: [address] [place] [id number] [phone]

## 2016-03-26 LAB — GC/CHLAMYDIA PROBE AMP (~~LOC~~) NOT AT ARMC
Chlamydia: NEGATIVE
Neisseria Gonorrhea: NEGATIVE

## 2016-03-26 LAB — ABO/RH: ABO/RH(D): O POS

## 2016-03-26 LAB — HIV ANTIBODY (ROUTINE TESTING W REFLEX): HIV Screen 4th Generation wRfx: NONREACTIVE

## 2016-04-19 ENCOUNTER — Encounter: Payer: Self-pay | Admitting: Certified Nurse Midwife

## 2016-04-19 ENCOUNTER — Ambulatory Visit (INDEPENDENT_AMBULATORY_CARE_PROVIDER_SITE_OTHER): Payer: 59 | Admitting: Certified Nurse Midwife

## 2016-04-19 DIAGNOSIS — Z3689 Encounter for other specified antenatal screening: Secondary | ICD-10-CM

## 2016-04-19 DIAGNOSIS — Z8619 Personal history of other infectious and parasitic diseases: Secondary | ICD-10-CM

## 2016-04-19 DIAGNOSIS — Z3490 Encounter for supervision of normal pregnancy, unspecified, unspecified trimester: Secondary | ICD-10-CM

## 2016-04-19 LAB — OB RESULTS CONSOLE GBS: GBS: POSITIVE

## 2016-04-19 MED ORDER — PRENATE PIXIE 10-0.6-0.4-200 MG PO CAPS: 1.0000 | ORAL_CAPSULE | Freq: Every day | ORAL | 12 refills | Status: DC

## 2016-04-19 NOTE — Addendum Note (Signed)
Addended by: Marya LandryFOSTER, Brentt Fread D on: 04/19/2016 04:26 PM   Modules accepted: Orders

## 2016-04-19 NOTE — Addendum Note (Signed)
Addended by: Marya LandryFOSTER, Adoni Greenough D on: 04/19/2016 04:21 PM   Modules accepted: Orders

## 2016-04-19 NOTE — Progress Notes (Signed)
Subjective:    Kayla [name] is being seen today for her first obstetrical visit.  This is a planned pregnancy. She is at [redacted]w[redacted]d gestation. Her obstetrical history is significant for obesity. Relationship with FOB: significant other, living together. Patient does intend to breast feed. Pregnancy history fully reviewed.  The information documented in the HPI was reviewed and verified.  Menstrual History: OB History    Gravida Para Term Preterm AB Living   3 0     2     SAB TAB Ectopic Multiple Live Births     2             Patient's last menstrual period was 02/06/2016 (approximate).    Past Medical History:  Diagnosis Date  . HSV (herpes simplex virus) infection     History reviewed. No pertinent surgical history.   (Not in a hospital admission) No Known Allergies  Social History  Substance Use Topics  . Smoking status: Former Games developer  . Smokeless tobacco: Former Neurosurgeon  . Alcohol use No    Family History  Problem Relation Age of Onset  . Heart disease Father   . Cancer Maternal Grandmother     leukemia  . Diabetes Maternal Grandmother   . Hyperlipidemia Maternal Grandfather   . Heart disease Paternal Grandmother      Review of Systems Constitutional: negative for weight loss Gastrointestinal: negative for vomiting, + nausea off and on Genitourinary:negative for genital lesions and vaginal discharge and dysuria Musculoskeletal:negative for back pain Behavioral/Psych: negative for abusive relationship, depression, illegal drug usage and tobacco use    Objective:    BP 114/78   Pulse 84   Wt 240 lb (108.9 kg)   LMP 02/06/2016 (Approximate)   BMI 43.90 kg/m  General Appearance:    Alert, cooperative, no distress, appears stated age  Head:    Normocephalic, without obvious abnormality, atraumatic  Eyes:    PERRL, conjunctiva/corneas clear, EOM's intact, fundi    benign, both eyes  Ears:    Normal TM's and external ear canals, both ears  Nose:   Nares normal, septum  midline, mucosa normal, no drainage    or sinus tenderness  Throat:   Lips, mucosa, and tongue normal; teeth and gums normal  Neck:   Supple, symmetrical, trachea midline, no adenopathy;    thyroid:  no enlargement/tenderness/nodules; no carotid   bruit or JVD  Back:     Symmetric, no curvature, ROM normal, no CVA tenderness  Lungs:     Clear to auscultation bilaterally, respirations unlabored  Chest Wall:    No tenderness or deformity   Heart:    Regular rate and rhythm, S1 and S2 normal, no murmur, rub   or gallop  Breast Exam:    No tenderness, masses, or nipple abnormality  Abdomen:     Soft, non-tender, bowel sounds active all four quadrants,    no masses, no organomegaly  Genitalia:    Normal female without lesion, discharge or tenderness  Extremities:   Extremities normal, atraumatic, no cyanosis or edema  Pulses:   2+ and symmetric all extremities  Skin:   Skin color, texture, turgor normal, no rashes or lesions  Lymph nodes:   Cervical, supraclavicular, and axillary nodes normal  Neurologic:   CNII-XII intact, normal strength, sensation and reflexes    throughout     FHR: seen with bedside US & cardiac activity    Lab Review Urine pregnancy test Labs reviewed yes Radiologic studies reviewed yes Assessment:  Pregnancy at 3777w3d weeks   Hx of HSV  Plan:      Prenatal vitamins.  Counseling provided regarding continued use of seat belts, cessation of alcohol consumption, smoking or use of illicit drugs; infection precautions i.e., influenza/TDAP immunizations, toxoplasmosis,CMV, parvovirus, listeria and varicella; workplace safety, exercise during pregnancy; routine dental care, safe medications, sexual activity, hot tubs, saunas, pools, travel, caffeine use, fish and methlymercury, potential toxins, hair treatments, varicose veins Weight gain recommendations per IOM guidelines reviewed: underweight/BMI< 18.5--> gain 28 - 40 lbs; normal weight/BMI 18.5 - 24.9--> gain 25 - 35  lbs; overweight/BMI 25 - 29.9--> gain 15 - 25 lbs; obese/BMI >30->gain  11 - 20 lbs Problem list reviewed and updated. FIRST/CF mutation testing/NIPT/QUAD SCREEN/fragile X/Ashkenazi Jewish population testing/Spinal muscular atrophy discussed: ordered. Role of ultrasound in pregnancy discussed; fetal survey: requested. Amniocentesis discussed: not indicated. VBAC calculator score: VBAC consent form provided Meds ordered this encounter  Medications  . Prenatal Vit-Fe Fumarate-FA (MULTIVITAMIN-PRENATAL) 27-0.8 MG TABS tablet    Sig: Take 1 tablet by mouth daily at 12 noon.  . Prenat-FeAsp-Meth-FA-DHA w/o A (PRENATE PIXIE) 10-0.6-0.4-200 MG CAPS    Sig: Take 1 tablet by mouth daily.    Dispense:  30 capsule    Refill:  12    Please process coupon: Rx BIN: [id number], RxPCN: [id number], RxGRP: [id number]: [id number], Rx: [id number]: [id number]  SUF: 01   Orders Placed This Encounter  Procedures  . Culture, OB Urine  . Hemoglobinopathy evaluation  . Varicella zoster antibody, IgG  . VITAMIN D 25 Hydroxy (Vit-D Deficiency, Fractures)  . Prenatal Profile I  . Cystic Fibrosis Mutation 97  . TSH  . HIV antibody  . ToxASSURE Select 13 (MW), Urine  . MaterniT21 PLUS Core+SCA    Order Specific Question:   Is the patient insulin dependent?    Answer:   No    Order Specific Question:   Please enter gestational age. This should be expressed as weeks AND days, i.e. 16w 6d. Enter weeks here. Enter days in next question.    Answer:   6310    Order Specific Question:   Please enter gestational age. This should be expressed as weeks AND days, i.e. 16w 6d. Enter days here. Enter weeks in previous question.    Answer:   3    Order Specific Question:   How was gestational age calculated?    Answer:   LMP    Order Specific Question:   Please give the date of LMP OR Ultrasound OR Estimated date of delivery.    Answer:   11/13/2015    Order Specific Question:   Number of Fetuses (Type of Pregnancy):    Answer:   1    Order Specific  Question:   Indications for performing the test? (please choose all that apply):    Answer:   Routine screening    Order Specific Question:   Other Indications? (Y=Yes, N=No)    Answer:   N    Order Specific Question:   If this is a repeat specimen, please indicate the reason:    Answer:   Not indicated    Order Specific Question:   Please specify the patient's race: (C=White/Caucasion, B=Black, I=Native American, A=Asian, H=Hispanic, O=Other, U=Unknown)    Answer:   B    Order Specific Question:   Donor Egg - indicate if the egg was obtained from in vitro fertilization.    Answer:   N    Order Specific Question:   Age of Egg Donor.  Answer:   [age]    Order Specific Question:   Prior Down Syndrome/ONTD screening during current pregnancy.    Answer:   N    Order Specific Question:   Prior First Trimester Testing    Answer:   N    Order Specific Question:   Prior Second Trimester Testing    Answer:   N    Order Specific Question:   Family History of Neural Tube Defects    Answer:   N    Order Specific Question:   Prior Pregnancy with Down Syndrome    Answer:   N    Order Specific Question:   Please give the patient's weight (in pounds)    Answer:   239  . Hemoglobin A1c    Follow up in 4 weeks. 50% of 30 min visit spent on counseling and coordination of care.

## 2016-04-20 ENCOUNTER — Encounter: Payer: Self-pay | Admitting: *Deleted

## 2016-04-20 NOTE — Telephone Encounter (Signed)
Please advise for pt instruction and/or if Rx is needed.

## 2016-04-23 LAB — CULTURE, OB URINE

## 2016-04-23 LAB — URINE CULTURE, OB REFLEX

## 2016-04-26 ENCOUNTER — Other Ambulatory Visit: Payer: Self-pay | Admitting: Certified Nurse Midwife

## 2016-04-26 DIAGNOSIS — Z3480 Encounter for supervision of other normal pregnancy, unspecified trimester: Secondary | ICD-10-CM

## 2016-04-26 DIAGNOSIS — B951 Streptococcus, group B, as the cause of diseases classified elsewhere: Secondary | ICD-10-CM | POA: Insufficient documentation

## 2016-04-26 LAB — MATERNIT21 PLUS CORE+SCA
CHROMOSOME 13: NEGATIVE
Chromosome 18: NEGATIVE
Chromosome 21: NEGATIVE
PDF: 0
Y Chromosome: NOT DETECTED

## 2016-04-27 ENCOUNTER — Encounter: Payer: Self-pay | Admitting: Certified Nurse Midwife

## 2016-04-28 ENCOUNTER — Encounter: Payer: Self-pay | Admitting: *Deleted

## 2016-04-28 LAB — PRENATAL PROFILE I(LABCORP)
ANTIBODY SCREEN: NEGATIVE
BASOS: 0 %
Basophils Absolute: 0 10*3/uL (ref 0.0–0.2)
EOS (ABSOLUTE): 0.1 10*3/uL (ref 0.0–0.4)
EOS: 1 %
HEMATOCRIT: 36.5 % (ref 34.0–46.6)
HEP B S AG: NEGATIVE
Hemoglobin: 12.4 g/dL (ref 11.1–15.9)
IMMATURE GRANS (ABS): 0 10*3/uL (ref 0.0–0.1)
IMMATURE GRANULOCYTES: 0 %
LYMPHS: 31 %
Lymphocytes Absolute: 2.3 10*3/uL (ref 0.7–3.1)
MCH: 26.5 pg — AB (ref 26.6–33.0)
MCHC: 34 g/dL (ref 31.5–35.7)
MCV: 78 fL — ABNORMAL LOW (ref 79–97)
Monocytes Absolute: 0.5 10*3/uL (ref 0.1–0.9)
Monocytes: 7 %
NEUTROS PCT: 61 %
Neutrophils Absolute: 4.4 10*3/uL (ref 1.4–7.0)
Platelets: 391 10*3/uL — ABNORMAL HIGH (ref 150–379)
RBC: 4.68 x10E6/uL (ref 3.77–5.28)
RDW: 13.7 % (ref 12.3–15.4)
RH TYPE: POSITIVE
RPR: NONREACTIVE
RUBELLA: 4.36 {index} (ref >0.99)
WBC: 7.3 10*3/uL (ref 3.4–10.8)

## 2016-04-28 LAB — HEMOGLOBINOPATHY EVALUATION
HEMOGLOBIN A2 QUANTITATION: 2.9 % (ref 0.7–3.1)
HEMOGLOBIN F QUANTITATION: 0 % (ref 0.0–2.0)
HGB A: 59.1 % — AB (ref 94.0–98.0)
HGB C: 38 % — ABNORMAL HIGH
HGB S: 0 %

## 2016-04-28 LAB — TOXASSURE SELECT 13 (MW), URINE

## 2016-04-28 LAB — VITAMIN D 25 HYDROXY (VIT D DEFICIENCY, FRACTURES): VIT D 25 HYDROXY: 16.1 ng/mL — AB (ref 30.0–100.0)

## 2016-04-28 LAB — CYSTIC FIBROSIS MUTATION 97: Interpretation: NOT DETECTED

## 2016-04-28 LAB — TSH: TSH: 1.22 u[IU]/mL (ref 0.450–4.500)

## 2016-04-28 LAB — VARICELLA ZOSTER ANTIBODY, IGG: Varicella zoster IgG: 2058 index (ref >165)

## 2016-04-28 LAB — HEMOGLOBIN A1C
ESTIMATED AVERAGE GLUCOSE: 103 mg/dL
HEMOGLOBIN A1C: 5.2 % (ref 4.8–5.6)

## 2016-04-28 LAB — HIV ANTIBODY (ROUTINE TESTING W REFLEX): HIV SCREEN 4TH GENERATION: NONREACTIVE

## 2016-04-29 ENCOUNTER — Encounter: Payer: Self-pay | Admitting: Certified Nurse Midwife

## 2016-04-30 ENCOUNTER — Other Ambulatory Visit: Payer: Self-pay | Admitting: Certified Nurse Midwife

## 2016-04-30 DIAGNOSIS — Z3493 Encounter for supervision of normal pregnancy, unspecified, third trimester: Secondary | ICD-10-CM

## 2016-05-10 ENCOUNTER — Encounter: Payer: Self-pay | Admitting: Certified Nurse Midwife

## 2016-05-16 ENCOUNTER — Emergency Department (HOSPITAL_COMMUNITY)
Admission: EM | Admit: 2016-05-16 | Discharge: 2016-05-16 | Disposition: A | Discharge status: Home / Self Care | Payer: 59 | Attending: Emergency Medicine | Admitting: Emergency Medicine

## 2016-05-16 ENCOUNTER — Encounter (HOSPITAL_COMMUNITY): Payer: Self-pay

## 2016-05-16 DIAGNOSIS — Y939 Activity, unspecified: Secondary | ICD-10-CM | POA: Diagnosis not present

## 2016-05-16 DIAGNOSIS — O9A212 Injury, poisoning and certain other consequences of external causes complicating pregnancy, second trimester: Secondary | ICD-10-CM | POA: Diagnosis present

## 2016-05-16 DIAGNOSIS — Y999 Unspecified external cause status: Secondary | ICD-10-CM | POA: Diagnosis not present

## 2016-05-16 DIAGNOSIS — Z87891 Personal history of nicotine dependence: Secondary | ICD-10-CM | POA: Diagnosis not present

## 2016-05-16 DIAGNOSIS — Z3492 Encounter for supervision of normal pregnancy, unspecified, second trimester: Secondary | ICD-10-CM

## 2016-05-16 DIAGNOSIS — Z3A14 14 weeks gestation of pregnancy: Secondary | ICD-10-CM | POA: Insufficient documentation

## 2016-05-16 DIAGNOSIS — S60811A Abrasion of right wrist, initial encounter: Secondary | ICD-10-CM | POA: Diagnosis not present

## 2016-05-16 DIAGNOSIS — Y92410 Unspecified street and highway as the place of occurrence of the external cause: Secondary | ICD-10-CM | POA: Diagnosis not present

## 2016-05-16 NOTE — Discharge Instructions (Signed)
Your ultrasound today showed normal fetal heart tones.  [facility].  If you experiences severely worsening pain, vaginal bleeding, loss of fluids, return to the emergency department for evaluation.
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
## 2016-05-17 ENCOUNTER — Ambulatory Visit (INDEPENDENT_AMBULATORY_CARE_PROVIDER_SITE_OTHER): Payer: 59 | Admitting: Certified Nurse Midwife

## 2016-05-17 ENCOUNTER — Encounter: Payer: Self-pay | Admitting: Obstetrics

## 2016-05-17 VITALS — BP 127/88 | HR 90 | Wt 236.0 lb

## 2016-05-17 DIAGNOSIS — B951 Streptococcus, group B, as the cause of diseases classified elsewhere: Secondary | ICD-10-CM

## 2016-05-17 DIAGNOSIS — Z8619 Personal history of other infectious and parasitic diseases: Secondary | ICD-10-CM

## 2016-05-17 DIAGNOSIS — O9A212 Injury, poisoning and certain other consequences of external causes complicating pregnancy, second trimester: Secondary | ICD-10-CM | POA: Insufficient documentation

## 2016-05-17 DIAGNOSIS — Z3492 Encounter for supervision of normal pregnancy, unspecified, second trimester: Secondary | ICD-10-CM

## 2016-05-17 DIAGNOSIS — Z3490 Encounter for supervision of normal pregnancy, unspecified, unspecified trimester: Secondary | ICD-10-CM

## 2016-05-17 MED ORDER — CYCLOBENZAPRINE HCL 10 MG PO TABS: 10.0000 mg | ORAL_TABLET | Freq: Three times a day (TID) | ORAL | 1 refills | Status: DC | PRN
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
## 2016-05-18 ENCOUNTER — Encounter: Payer: Self-pay | Admitting: Certified Nurse Midwife

## 2016-05-19 ENCOUNTER — Encounter: Payer: Self-pay | Admitting: *Deleted

## 2016-05-19 ENCOUNTER — Ambulatory Visit: Payer: 59

## 2016-05-21 ENCOUNTER — Ambulatory Visit (INDEPENDENT_AMBULATORY_CARE_PROVIDER_SITE_OTHER): Payer: 59 | Admitting: Physician Assistant

## 2016-05-21 ENCOUNTER — Encounter: Payer: Self-pay | Admitting: Physician Assistant

## 2016-05-21 ENCOUNTER — Encounter: Payer: Self-pay | Admitting: Certified Nurse Midwife

## 2016-05-21 DIAGNOSIS — S39012A Strain of muscle, fascia and tendon of lower back, initial encounter: Secondary | ICD-10-CM

## 2016-05-21 DIAGNOSIS — S161XXA Strain of muscle, fascia and tendon at neck level, initial encounter: Secondary | ICD-10-CM | POA: Diagnosis not present

## 2016-05-21 DIAGNOSIS — Z3A15 15 weeks gestation of pregnancy: Secondary | ICD-10-CM

## 2016-05-21 DIAGNOSIS — T148XXA Other injury of unspecified body region, initial encounter: Secondary | ICD-10-CM
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
## 2016-05-21 NOTE — Patient Instructions (Addendum)
Increase the Flexeril to 3x/day  Heat to neck and back Try to go back to work on Monday if not able - please let me know and I will extend your work note through Wednesday and then if you are not able to return to work on thrusday you will need to be seen   IF you received an x-ray today, you will receive an invoice from [facility]. Please contact [facility] at [phone] with questions or concerns regarding your invoice.   IF you received labwork today, you will receive an invoice from [facility]. Please contact [facility] at [phone] with questions or concerns regarding your invoice.   Our billing staff will not be able to assist you with questions regarding bills from these companies.  You will be contacted with the lab results as soon as they are available. The fastest way to get your results is to activate your My Chart account. Instructions are located on the last page of this paperwork. If you have not heard from us regarding the results in 2 weeks, please contact this office.

## 2016-05-22 ENCOUNTER — Inpatient Hospital Stay (HOSPITAL_COMMUNITY)
Admission: AD | Admit: 2016-05-22 | Discharge: 2016-05-22 | Disposition: A | Discharge status: Home / Self Care | Payer: 59 | Source: Ambulatory Visit | Attending: Obstetrics & Gynecology | Admitting: Obstetrics & Gynecology

## 2016-05-22 DIAGNOSIS — Z3482 Encounter for supervision of other normal pregnancy, second trimester: Secondary | ICD-10-CM | POA: Diagnosis present

## 2016-05-22 NOTE — Progress Notes (Signed)
Patient was concerned about not hearing a fetal heart rate. Patient voiced no other complaints; states she is just here to hear the fetal heart rate. Fetal heart was auscultated by Doppler at 145.
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
## 2016-05-24 ENCOUNTER — Encounter: Payer: Self-pay | Admitting: Physician Assistant

## 2016-05-24 ENCOUNTER — Telehealth: Payer: Self-pay

## 2016-05-24 ENCOUNTER — Encounter: Payer: Self-pay | Admitting: Certified Nurse Midwife

## 2016-05-24 ENCOUNTER — Telehealth: Payer: Self-pay | Admitting: Family Medicine

## 2016-05-24 NOTE — Telephone Encounter (Signed)
Returned patient's call to gather additional info about her request to be out of work, no answer, left vm to call.
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
## 2016-05-24 NOTE — Telephone Encounter (Signed)
Patient called in requesting a note stating that she can return to work on 05-27-16. Provider approved and pt stated that she would pick it up today.

## 2016-05-25 ENCOUNTER — Encounter: Payer: Self-pay | Admitting: Certified Nurse Midwife

## 2016-05-25 ENCOUNTER — Encounter: Payer: Self-pay | Admitting: Physician Assistant

## 2016-05-25 ENCOUNTER — Telehealth: Payer: Self-pay

## 2016-05-25 NOTE — Telephone Encounter (Signed)
Sent mychart message to patient

## 2016-05-25 NOTE — Telephone Encounter (Signed)
Ms Byers, ° °My name is Caitlin I handle all disability claims here at [facility] and I have not gotten any request from [employer] about needing paperwork or office notes for you. ° °If this is something that you are needing us to fax to them then you will have to fill out a release of info giving us permission to release the records to [employer]. Without the release I can not release anything. ° °You can either come into the office to get a release and complete it or print it off our website [email or web address] and print it out and either mail it to us or fax it at [phone]. ° °Thank you and have a good day! °

## 2016-05-25 NOTE — Telephone Encounter (Signed)
Ok to write a note through Wednesday - please let the patient know that if she cannot go back to work on Thursday she will need an OV

## 2016-05-25 NOTE — Telephone Encounter (Signed)
Ms Etheleen MayhewLeach,  My name is Luther ParodyCaitlin I handle all disability [facility] at [facility] and I have not gotten any request from [facility] about needing paperwork or office notes for you.  If this is something that you are needing us to fax to them then you will have to fill out a release of info giving us permission to release the records to [place]. Without the release I can not release anything.  You can either come into the office to get a release and complete it or print it off our website [email or web address] and print it out and either mail it to us or fax it at [phone].  Thank you and have a good day!

## 2016-05-27 ENCOUNTER — Ambulatory Visit: Payer: 59

## 2016-05-27 ENCOUNTER — Telehealth: Payer: Self-pay

## 2016-05-28 ENCOUNTER — Encounter: Payer: Self-pay | Admitting: Physical Therapy

## 2016-05-28 ENCOUNTER — Ambulatory Visit: Payer: 59 | Attending: Physician Assistant | Admitting: Physical Therapy

## 2016-05-28 DIAGNOSIS — M542 Cervicalgia: Secondary | ICD-10-CM

## 2016-05-28 DIAGNOSIS — M545 Low back pain: Secondary | ICD-10-CM | POA: Insufficient documentation
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
## 2016-06-07 ENCOUNTER — Ambulatory Visit: Payer: 59 | Admitting: Physical Therapy

## 2016-06-07 ENCOUNTER — Telehealth: Payer: Self-pay

## 2016-06-07 ENCOUNTER — Encounter: Payer: Self-pay | Admitting: Certified Nurse Midwife

## 2016-06-07 NOTE — Telephone Encounter (Signed)
[facility] claim. [id number].

## 2016-06-08 ENCOUNTER — Ambulatory Visit: Payer: 59 | Admitting: Physical Therapy

## 2016-06-15 ENCOUNTER — Ambulatory Visit (INDEPENDENT_AMBULATORY_CARE_PROVIDER_SITE_OTHER): Payer: 59 | Admitting: Certified Nurse Midwife

## 2016-06-15 VITALS — BP 115/78 | HR 87 | Wt 240.0 lb

## 2016-06-15 DIAGNOSIS — B951 Streptococcus, group B, as the cause of diseases classified elsewhere: Secondary | ICD-10-CM

## 2016-06-15 DIAGNOSIS — Z3482 Encounter for supervision of other normal pregnancy, second trimester: Secondary | ICD-10-CM

## 2016-06-15 DIAGNOSIS — Z3480 Encounter for supervision of other normal pregnancy, unspecified trimester: Secondary | ICD-10-CM

## 2016-06-15 DIAGNOSIS — O9A212 Injury, poisoning and certain other consequences of external causes complicating pregnancy, second trimester: Secondary | ICD-10-CM

## 2016-06-15 DIAGNOSIS — Z8619 Personal history of other infectious and parasitic diseases: Secondary | ICD-10-CM

## 2016-06-15 MED ORDER — VALACYCLOVIR HCL 1 G PO TABS: 1000.0000 mg | ORAL_TABLET | Freq: Two times a day (BID) | ORAL | 0 refills | Status: DC

## 2016-06-15 NOTE — Patient Instructions (Addendum)
Second Trimester of Pregnancy The second trimester is from week 13 through week 28 (months 4 through 6). The second trimester is often a time when you feel your best. Your body has also adjusted to being pregnant, and you begin to feel better physically. Usually, morning sickness has lessened or quit completely, you may have more energy, and you may have an increase in appetite. The second trimester is also a time when the fetus is growing rapidly. At the end of the sixth month, the fetus is about 9 inches long and weighs about 1 pounds. You will likely begin to feel the baby move (quickening) between 18 and 20 weeks of the pregnancy. Body changes during your second trimester Your body continues to go through many changes during your second trimester. The changes vary from woman to woman.  Your weight will continue to increase. You will notice your lower abdomen bulging out.  You may begin to get stretch marks on your hips, abdomen, and breasts.  You may develop headaches that can be relieved by medicines. The medicines should be approved by your health care provider.  You may urinate more often because the fetus is pressing on your bladder.  You may develop or continue to have heartburn as a result of your pregnancy.  You may develop constipation because certain hormones are causing the muscles that push waste through your intestines to slow down.  You may develop hemorrhoids or swollen, bulging veins (varicose veins).  You may have back pain. This is caused by:  Weight gain.  Pregnancy hormones that are relaxing the joints in your pelvis.  A shift in weight and the muscles that support your balance.  Your breasts will continue to grow and they will continue to become tender.  Your gums may bleed and may be sensitive to brushing and flossing.  Dark spots or blotches (chloasma, mask of pregnancy) may develop on your face. This will likely fade after the baby is born.  A dark line  from your belly button to the pubic area (linea nigra) may appear. This will likely fade after the baby is born.  You may have changes in your hair. These can include thickening of your hair, rapid growth, and changes in texture. Some women also have hair loss during or after pregnancy, or hair that feels dry or thin. Your hair will most likely return to normal after your baby is born. What to expect at prenatal visits During a routine prenatal visit:  You will be weighed to make sure you and the fetus are growing normally.  Your blood pressure will be taken.  Your abdomen will be measured to track your baby's growth.  The fetal heartbeat will be listened to.  Any test results from the previous visit will be discussed. Your health care provider may ask you:  How you are feeling.  If you are feeling the baby move.  If you have had any abnormal symptoms, such as leaking fluid, bleeding, severe headaches, or abdominal cramping.  If you are using any tobacco products, including cigarettes, chewing tobacco, and electronic cigarettes.  If you have any questions. Other tests that may be performed during your second trimester include:  Blood tests that check for:  Low iron levels (anemia).  Gestational diabetes (between 24 and 28 weeks).  Rh antibodies. This is to check for a protein on red blood cells (Rh factor).  Urine tests to check for infections, diabetes, or protein in the urine.  An ultrasound to   confirm the proper growth and development of the baby.  An amniocentesis to check for possible genetic problems.  Fetal screens for spina bifida and Down syndrome.  HIV (human immunodeficiency virus) testing. Routine prenatal testing includes screening for HIV, unless you choose not to have this test. Follow these instructions at home: Eating and drinking  Continue to eat regular, healthy meals.  Avoid raw meat, uncooked cheese, cat litter boxes, and soil used by cats. These  carry germs that can cause birth defects in the baby.  Take your prenatal vitamins.  Take 1500-2000 mg of calcium daily starting at the 20th week of pregnancy until you deliver your baby.  If you develop constipation:  Take over-the-counter or prescription medicines.  Drink enough fluid to keep your urine clear or pale yellow.  Eat foods that are high in fiber, such as fresh fruits and vegetables, whole grains, and beans.  Limit foods that are high in fat and processed sugars, such as fried and sweet foods. Activity  Exercise only as directed by your health care provider. Experiencing uterine cramps is a good sign to stop exercising.  Avoid heavy lifting, wear low heel shoes, and practice good posture.  Wear your seat belt at all times when driving.  Rest with your legs elevated if you have leg cramps or low back pain.  Wear a good support bra for breast tenderness.  Do not use hot tubs, steam rooms, or saunas. Lifestyle  Avoid all smoking, herbs, alcohol, and unprescribed drugs. These chemicals affect the formation and growth of the baby.  Do not use any products that contain nicotine or tobacco, such as cigarettes and e-cigarettes. If you need help quitting, ask your health care provider.  A sexual relationship may be continued unless your health care provider directs you otherwise. General instructions  Follow your health care provider's instructions regarding medicine use. There are medicines that are either safe or unsafe to take during pregnancy.  Take warm sitz baths to soothe any pain or discomfort caused by hemorrhoids. Use hemorrhoid cream if your health care provider approves.  If you develop varicose veins, wear support hose. Elevate your feet for 15 minutes, 3-4 times a day. Limit salt in your diet.  Visit your dentist if you have not gone yet during your pregnancy. Use a soft toothbrush to brush your teeth and be gentle when you floss.  Keep all follow-up  prenatal visits as told by your health care provider. This is important. Contact a health care provider if:  You have dizziness.  You have mild pelvic cramps, pelvic pressure, or nagging pain in the abdominal area.  You have persistent nausea, vomiting, or diarrhea.  You have a bad smelling vaginal discharge.  You have pain with urination. Get help right away if:  You have a fever.  You are leaking fluid from your vagina.  You have spotting or bleeding from your vagina.  You have severe abdominal cramping or pain.  You have rapid weight gain or weight loss.  You have shortness of breath with chest pain.  You notice sudden or extreme swelling of your face, hands, ankles, feet, or legs.  You have not felt your baby move in over an hour.  You have severe headaches that do not go away with medicine.  You have vision changes. Summary  The second trimester is from week 13 through week 28 (months 4 through 6). It is also a time when the fetus is growing rapidly.  Your body goes   through many changes during pregnancy. The changes vary from woman to woman.  Avoid all smoking, herbs, alcohol, and unprescribed drugs. These chemicals affect the formation and growth your baby.  Do not use any tobacco products, such as cigarettes, chewing tobacco, and e-cigarettes. If you need help quitting, ask your health care provider.  Contact your health care provider if you have any questions. Keep all prenatal visits as told by your health care provider. This is important. This information is not intended to replace advice given to you by your health care provider. Make sure you discuss any questions you have with your health care provider. Document Released: 06/01/2001 Document Revised: 11/13/2015 Document Reviewed: 08/08/2012 Elsevier Interactive Patient Education  2017 Elsevier Inc.  
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
## 2016-06-16 ENCOUNTER — Telehealth: Payer: Self-pay | Admitting: Physical Therapy

## 2016-06-16 ENCOUNTER — Ambulatory Visit: Payer: 59 | Admitting: Physical Therapy

## 2016-06-16 NOTE — Telephone Encounter (Signed)
Patient needs PT but she is unable to attend due to unreliable transportation.  She wishes to cancel all her future appointments.   She will call to schedule if she does find transportation.   Other clinic sites offered, however none are in her area. Liz BeachKaren Ronav Furney PTA

## 2016-06-17 ENCOUNTER — Ambulatory Visit: Payer: 59 | Admitting: Physical Therapy

## 2016-06-17 LAB — AFP, QUAD SCREEN
DIA Mom Value: 1.08
DIA VALUE (EIA): 151.72 pg/mL
DSR (By Age)    1 IN: 789
DSR (Second Trimester) 1 IN: 2118
GESTATIONAL AGE AFP: 18.9 wk
MSAFP Mom: 0.81
MSAFP: 32.7 ng/mL
MSHCG MOM: 1.47
MSHCG: 27684 m[IU]/mL
Maternal Age At EDD: 28.9 YEARS
Osb Risk: 10000
Test Results:: NEGATIVE
UE3 MOM: 1
UE3 VALUE: 1.4 ng/mL
WEIGHT: 240 [lb_av]

## 2016-06-18 ENCOUNTER — Other Ambulatory Visit: Payer: Self-pay | Admitting: Certified Nurse Midwife

## 2016-06-18 DIAGNOSIS — Z3480 Encounter for supervision of other normal pregnancy, unspecified trimester: Secondary | ICD-10-CM

## 2016-06-22 ENCOUNTER — Ambulatory Visit: Payer: 59 | Admitting: Physical Therapy

## 2016-06-24 ENCOUNTER — Ambulatory Visit: Payer: 59 | Admitting: Physical Therapy

## 2016-06-25 ENCOUNTER — Other Ambulatory Visit: Payer: Self-pay | Admitting: Certified Nurse Midwife

## 2016-06-25 ENCOUNTER — Ambulatory Visit (INDEPENDENT_AMBULATORY_CARE_PROVIDER_SITE_OTHER): Payer: Medicaid Other

## 2016-06-25 DIAGNOSIS — Z3492 Encounter for supervision of normal pregnancy, unspecified, second trimester: Secondary | ICD-10-CM | POA: Diagnosis not present

## 2016-06-25 DIAGNOSIS — Z3480 Encounter for supervision of other normal pregnancy, unspecified trimester: Secondary | ICD-10-CM

## 2016-06-25 DIAGNOSIS — Z3490 Encounter for supervision of normal pregnancy, unspecified, unspecified trimester: Secondary | ICD-10-CM

## 2016-06-27 NOTE — Telephone Encounter (Signed)
error 

## 2016-06-29 ENCOUNTER — Other Ambulatory Visit: Payer: Self-pay | Admitting: Certified Nurse Midwife

## 2016-06-29 ENCOUNTER — Encounter: Payer: 59 | Admitting: Physical Therapy

## 2016-07-01 ENCOUNTER — Encounter: Payer: Self-pay | Admitting: Certified Nurse Midwife

## 2016-07-01 ENCOUNTER — Encounter: Payer: 59 | Admitting: Physical Therapy

## 2016-07-13 ENCOUNTER — Ambulatory Visit (INDEPENDENT_AMBULATORY_CARE_PROVIDER_SITE_OTHER): Payer: 59 | Admitting: Certified Nurse Midwife

## 2016-07-13 VITALS — BP 118/75 | HR 110 | Temp 98.0°F | Wt 241.0 lb

## 2016-07-13 DIAGNOSIS — O9A212 Injury, poisoning and certain other consequences of external causes complicating pregnancy, second trimester: Secondary | ICD-10-CM

## 2016-07-13 DIAGNOSIS — Z8619 Personal history of other infectious and parasitic diseases: Secondary | ICD-10-CM

## 2016-07-13 DIAGNOSIS — Z3480 Encounter for supervision of other normal pregnancy, unspecified trimester: Secondary | ICD-10-CM

## 2016-07-13 DIAGNOSIS — Z3482 Encounter for supervision of other normal pregnancy, second trimester: Secondary | ICD-10-CM

## 2016-07-13 DIAGNOSIS — B951 Streptococcus, group B, as the cause of diseases classified elsewhere: Secondary | ICD-10-CM

## 2016-07-13 MED ORDER — ACYCLOVIR 200 MG/5ML PO SUSP: 800.0000 mg | Freq: Two times a day (BID) | ORAL | 6 refills | Status: DC
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
## 2016-08-05 ENCOUNTER — Telehealth: Payer: Self-pay

## 2016-08-05 NOTE — Telephone Encounter (Signed)
Returned call, patient previously stated that it was ok to leave detailed vm.Kayla [name].Kayla [name].Kayla [name].Kayla [name].patient wanted to know if she could x-ray at the dentist, per Dr. Clearance CootsHarper, I advised the patient that it is fine as long as they shield her abdomen.

## 2016-08-10 ENCOUNTER — Encounter: Payer: Self-pay | Admitting: Certified Nurse Midwife

## 2016-08-10 ENCOUNTER — Ambulatory Visit (INDEPENDENT_AMBULATORY_CARE_PROVIDER_SITE_OTHER): Payer: Medicaid Other | Admitting: Certified Nurse Midwife

## 2016-08-10 ENCOUNTER — Encounter: Payer: Self-pay | Admitting: *Deleted

## 2016-08-10 VITALS — Wt 241.0 lb

## 2016-08-10 DIAGNOSIS — Z3482 Encounter for supervision of other normal pregnancy, second trimester: Secondary | ICD-10-CM

## 2016-08-10 DIAGNOSIS — Z8619 Personal history of other infectious and parasitic diseases: Secondary | ICD-10-CM

## 2016-08-10 DIAGNOSIS — B951 Streptococcus, group B, as the cause of diseases classified elsewhere: Secondary | ICD-10-CM

## 2016-08-10 DIAGNOSIS — Z3480 Encounter for supervision of other normal pregnancy, unspecified trimester: Secondary | ICD-10-CM

## 2016-08-10 DIAGNOSIS — O9A212 Injury, poisoning and certain other consequences of external causes complicating pregnancy, second trimester: Secondary | ICD-10-CM
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
## 2016-08-12 ENCOUNTER — Telehealth: Payer: Self-pay

## 2016-08-12 NOTE — Telephone Encounter (Signed)
error 

## 2016-08-24 ENCOUNTER — Ambulatory Visit (INDEPENDENT_AMBULATORY_CARE_PROVIDER_SITE_OTHER): Payer: Managed Care, Other (non HMO) | Admitting: Certified Nurse Midwife

## 2016-08-24 ENCOUNTER — Encounter: Payer: Self-pay | Admitting: Certified Nurse Midwife

## 2016-08-24 ENCOUNTER — Other Ambulatory Visit: Payer: Medicaid Other

## 2016-08-24 VITALS — BP 125/82 | HR 102 | Wt 241.4 lb

## 2016-08-24 DIAGNOSIS — B951 Streptococcus, group B, as the cause of diseases classified elsewhere: Secondary | ICD-10-CM

## 2016-08-24 DIAGNOSIS — Z8619 Personal history of other infectious and parasitic diseases: Secondary | ICD-10-CM

## 2016-08-24 DIAGNOSIS — Z3480 Encounter for supervision of other normal pregnancy, unspecified trimester: Secondary | ICD-10-CM

## 2016-08-24 DIAGNOSIS — Z23 Encounter for immunization: Secondary | ICD-10-CM

## 2016-08-24 DIAGNOSIS — Z3483 Encounter for supervision of other normal pregnancy, third trimester: Secondary | ICD-10-CM
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
## 2016-08-24 NOTE — Progress Notes (Signed)
Patient reports she is doing well. Patient does have general pregnancy questions.

## 2016-08-25 ENCOUNTER — Encounter: Payer: Self-pay | Admitting: Certified Nurse Midwife

## 2016-08-25 ENCOUNTER — Other Ambulatory Visit: Payer: Self-pay | Admitting: Certified Nurse Midwife

## 2016-08-25 DIAGNOSIS — Z3480 Encounter for supervision of other normal pregnancy, unspecified trimester: Secondary | ICD-10-CM

## 2016-08-25 LAB — CBC
HEMATOCRIT: 32 % — AB (ref 34.0–46.6)
Hemoglobin: 10.3 g/dL — ABNORMAL LOW (ref 11.1–15.9)
MCH: 25.3 pg — ABNORMAL LOW (ref 26.6–33.0)
MCHC: 32.2 g/dL (ref 31.5–35.7)
MCV: 79 fL (ref 79–97)
Platelets: 294 10*3/uL (ref 150–379)
RBC: 4.07 x10E6/uL (ref 3.77–5.28)
RDW: 14.7 % (ref 12.3–15.4)
WBC: 8.4 10*3/uL (ref 3.4–10.8)

## 2016-08-25 LAB — GLUCOSE TOLERANCE, 2 HOURS W/ 1HR
GLUCOSE, 2 HOUR: 75 mg/dL (ref 65–152)
Glucose, 1 hour: 102 mg/dL (ref 65–179)
Glucose, Fasting: 78 mg/dL (ref 65–91)

## 2016-08-25 LAB — HIV ANTIBODY (ROUTINE TESTING W REFLEX): HIV Screen 4th Generation wRfx: NONREACTIVE

## 2016-08-25 LAB — RPR: RPR Ser Ql: NONREACTIVE

## 2016-08-26 ENCOUNTER — Encounter: Payer: Self-pay | Admitting: Certified Nurse Midwife

## 2016-09-07 ENCOUNTER — Ambulatory Visit (INDEPENDENT_AMBULATORY_CARE_PROVIDER_SITE_OTHER): Payer: Managed Care, Other (non HMO) | Admitting: Certified Nurse Midwife

## 2016-09-07 VITALS — BP 133/85 | HR 97 | Wt 242.4 lb

## 2016-09-07 DIAGNOSIS — Z3483 Encounter for supervision of other normal pregnancy, third trimester: Secondary | ICD-10-CM

## 2016-09-07 DIAGNOSIS — Z8619 Personal history of other infectious and parasitic diseases: Secondary | ICD-10-CM

## 2016-09-07 DIAGNOSIS — B951 Streptococcus, group B, as the cause of diseases classified elsewhere: Secondary | ICD-10-CM

## 2016-09-07 DIAGNOSIS — Z3480 Encounter for supervision of other normal pregnancy, unspecified trimester: Secondary | ICD-10-CM

## 2016-09-07 NOTE — Progress Notes (Signed)
Patient reports good fetal movement, and states that she felt one contraction a week ago.
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
## 2016-09-10 ENCOUNTER — Encounter: Payer: Self-pay | Admitting: Certified Nurse Midwife

## 2016-09-11 ENCOUNTER — Encounter: Payer: Self-pay | Admitting: Certified Nurse Midwife

## 2016-09-13 ENCOUNTER — Other Ambulatory Visit: Payer: Self-pay | Admitting: Certified Nurse Midwife

## 2016-09-13 ENCOUNTER — Encounter: Payer: Self-pay | Admitting: Certified Nurse Midwife

## 2016-09-21 ENCOUNTER — Encounter: Payer: Self-pay | Admitting: *Deleted

## 2016-09-21 ENCOUNTER — Ambulatory Visit (INDEPENDENT_AMBULATORY_CARE_PROVIDER_SITE_OTHER): Payer: Managed Care, Other (non HMO) | Admitting: Certified Nurse Midwife

## 2016-09-21 VITALS — BP 131/86 | HR 96 | Wt 244.4 lb

## 2016-09-21 DIAGNOSIS — B951 Streptococcus, group B, as the cause of diseases classified elsewhere: Secondary | ICD-10-CM

## 2016-09-21 DIAGNOSIS — Z3480 Encounter for supervision of other normal pregnancy, unspecified trimester: Secondary | ICD-10-CM

## 2016-09-21 DIAGNOSIS — B009 Herpesviral infection, unspecified: Secondary | ICD-10-CM

## 2016-09-21 NOTE — Progress Notes (Signed)
PATIENT IS HAVING A LITTLE SWELLING IN HER HANDS

## 2016-09-22 ENCOUNTER — Encounter: Payer: Self-pay | Admitting: Certified Nurse Midwife

## 2016-09-22 DIAGNOSIS — B009 Herpesviral infection, unspecified: Secondary | ICD-10-CM | POA: Insufficient documentation
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
## 2016-10-06 ENCOUNTER — Ambulatory Visit (INDEPENDENT_AMBULATORY_CARE_PROVIDER_SITE_OTHER): Payer: Managed Care, Other (non HMO) | Admitting: Certified Nurse Midwife

## 2016-10-06 ENCOUNTER — Encounter: Payer: Self-pay | Admitting: Certified Nurse Midwife

## 2016-10-06 VITALS — BP 135/81 | HR 109 | Wt 248.0 lb

## 2016-10-06 DIAGNOSIS — B951 Streptococcus, group B, as the cause of diseases classified elsewhere: Secondary | ICD-10-CM

## 2016-10-06 DIAGNOSIS — Z3480 Encounter for supervision of other normal pregnancy, unspecified trimester: Secondary | ICD-10-CM

## 2016-10-06 DIAGNOSIS — Z3483 Encounter for supervision of other normal pregnancy, third trimester: Secondary | ICD-10-CM

## 2016-10-06 DIAGNOSIS — Z8619 Personal history of other infectious and parasitic diseases: Secondary | ICD-10-CM
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
## 2016-10-06 NOTE — Progress Notes (Signed)
Patient complains of pain in her hands in the mornings

## 2016-10-08 ENCOUNTER — Encounter: Payer: Self-pay | Admitting: Certified Nurse Midwife

## 2016-10-10 ENCOUNTER — Encounter: Payer: Self-pay | Admitting: Certified Nurse Midwife

## 2016-10-11 ENCOUNTER — Encounter: Payer: Self-pay | Admitting: *Deleted

## 2016-10-12 ENCOUNTER — Encounter: Payer: Self-pay | Admitting: Certified Nurse Midwife

## 2016-10-21 ENCOUNTER — Ambulatory Visit (INDEPENDENT_AMBULATORY_CARE_PROVIDER_SITE_OTHER): Payer: Managed Care, Other (non HMO) | Admitting: Certified Nurse Midwife

## 2016-10-21 VITALS — BP 127/78 | HR 109 | Wt 248.0 lb

## 2016-10-21 DIAGNOSIS — Z3483 Encounter for supervision of other normal pregnancy, third trimester: Secondary | ICD-10-CM

## 2016-10-21 DIAGNOSIS — B951 Streptococcus, group B, as the cause of diseases classified elsewhere: Secondary | ICD-10-CM

## 2016-10-21 DIAGNOSIS — Z8619 Personal history of other infectious and parasitic diseases: Secondary | ICD-10-CM

## 2016-10-21 DIAGNOSIS — Z3480 Encounter for supervision of other normal pregnancy, unspecified trimester: Secondary | ICD-10-CM
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
## 2016-10-28 ENCOUNTER — Encounter: Payer: Self-pay | Admitting: Obstetrics

## 2016-10-28 ENCOUNTER — Ambulatory Visit (INDEPENDENT_AMBULATORY_CARE_PROVIDER_SITE_OTHER): Payer: Managed Care, Other (non HMO) | Admitting: Obstetrics

## 2016-10-28 DIAGNOSIS — Z3483 Encounter for supervision of other normal pregnancy, third trimester: Secondary | ICD-10-CM

## 2016-10-28 DIAGNOSIS — Z3480 Encounter for supervision of other normal pregnancy, unspecified trimester: Secondary | ICD-10-CM
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
## 2016-10-28 NOTE — Progress Notes (Signed)
Pt c/o not feeling well last night had diarrhea and chills. Pt requests cx check.

## 2016-11-04 ENCOUNTER — Inpatient Hospital Stay (HOSPITAL_COMMUNITY)
Admission: AD | Admit: 2016-11-04 | Discharge: 2016-11-08 | DRG: 765 | Disposition: A | Discharge status: Home / Self Care | Payer: Managed Care, Other (non HMO) | Source: Ambulatory Visit | Attending: Obstetrics and Gynecology | Admitting: Obstetrics and Gynecology

## 2016-11-04 ENCOUNTER — Encounter (HOSPITAL_COMMUNITY): Payer: Self-pay

## 2016-11-04 ENCOUNTER — Encounter: Payer: Self-pay | Admitting: Certified Nurse Midwife

## 2016-11-04 ENCOUNTER — Ambulatory Visit (INDEPENDENT_AMBULATORY_CARE_PROVIDER_SITE_OTHER): Payer: Managed Care, Other (non HMO) | Admitting: Certified Nurse Midwife

## 2016-11-04 VITALS — BP 157/89 | HR 102 | Wt 252.3 lb

## 2016-11-04 DIAGNOSIS — Z3A38 38 weeks gestation of pregnancy: Secondary | ICD-10-CM

## 2016-11-04 DIAGNOSIS — Z3480 Encounter for supervision of other normal pregnancy, unspecified trimester: Secondary | ICD-10-CM

## 2016-11-04 DIAGNOSIS — B951 Streptococcus, group B, as the cause of diseases classified elsewhere: Secondary | ICD-10-CM

## 2016-11-04 DIAGNOSIS — O99214 Obesity complicating childbirth: Secondary | ICD-10-CM | POA: Diagnosis present

## 2016-11-04 DIAGNOSIS — B009 Herpesviral infection, unspecified: Secondary | ICD-10-CM

## 2016-11-04 DIAGNOSIS — A6000 Herpesviral infection of urogenital system, unspecified: Secondary | ICD-10-CM | POA: Diagnosis present

## 2016-11-04 DIAGNOSIS — O133 Gestational [pregnancy-induced] hypertension without significant proteinuria, third trimester: Secondary | ICD-10-CM

## 2016-11-04 DIAGNOSIS — Z87891 Personal history of nicotine dependence: Secondary | ICD-10-CM

## 2016-11-04 DIAGNOSIS — Z8619 Personal history of other infectious and parasitic diseases: Secondary | ICD-10-CM

## 2016-11-04 DIAGNOSIS — Z6841 Body Mass Index (BMI) 40.0 and over, adult: Secondary | ICD-10-CM | POA: Diagnosis not present

## 2016-11-04 DIAGNOSIS — O99820 Streptococcus B carrier state complicating pregnancy: Secondary | ICD-10-CM

## 2016-11-04 DIAGNOSIS — O321XX0 Maternal care for breech presentation, not applicable or unspecified: Secondary | ICD-10-CM | POA: Diagnosis present

## 2016-11-04 DIAGNOSIS — Z3A39 39 weeks gestation of pregnancy: Secondary | ICD-10-CM

## 2016-11-04 DIAGNOSIS — O134 Gestational [pregnancy-induced] hypertension without significant proteinuria, complicating childbirth: Secondary | ICD-10-CM | POA: Diagnosis present

## 2016-11-04 DIAGNOSIS — O9832 Other infections with a predominantly sexual mode of transmission complicating childbirth: Secondary | ICD-10-CM | POA: Diagnosis present

## 2016-11-04 DIAGNOSIS — O99824 Streptococcus B carrier state complicating childbirth: Secondary | ICD-10-CM | POA: Diagnosis present

## 2016-11-04 DIAGNOSIS — D649 Anemia, unspecified: Secondary | ICD-10-CM | POA: Diagnosis present

## 2016-11-04 DIAGNOSIS — Z8759 Personal history of other complications of pregnancy, childbirth and the puerperium: Secondary | ICD-10-CM | POA: Diagnosis present

## 2016-11-04 DIAGNOSIS — O9902 Anemia complicating childbirth: Secondary | ICD-10-CM | POA: Diagnosis present

## 2016-11-04 DIAGNOSIS — O329XX0 Maternal care for malpresentation of fetus, unspecified, not applicable or unspecified: Secondary | ICD-10-CM | POA: Diagnosis not present

## 2016-11-04 HISTORY — DX: Other specified health status: Z78.9

## 2016-11-04 LAB — COMPREHENSIVE METABOLIC PANEL
ALT: 12 U/L — ABNORMAL LOW (ref 14–54)
ANION GAP: 9 (ref 5–15)
AST: 23 U/L (ref 15–41)
Albumin: 2.8 g/dL — ABNORMAL LOW (ref 3.5–5.0)
Alkaline Phosphatase: 129 U/L — ABNORMAL HIGH (ref 38–126)
BILIRUBIN TOTAL: 0.1 mg/dL — AB (ref 0.3–1.2)
CO2: 21 mmol/L — ABNORMAL LOW (ref 22–32)
Calcium: 9 mg/dL (ref 8.9–10.3)
Chloride: 107 mmol/L (ref 101–111)
Creatinine, Ser: 0.49 mg/dL (ref 0.44–1.00)
Glucose, Bld: 82 mg/dL (ref 65–99)
POTASSIUM: 3.8 mmol/L (ref 3.5–5.1)
Sodium: 137 mmol/L (ref 135–145)
TOTAL PROTEIN: 6.1 g/dL — AB (ref 6.5–8.1)

## 2016-11-04 LAB — CBC
HEMATOCRIT: 31.2 % — AB (ref 36.0–46.0)
HEMOGLOBIN: 10.9 g/dL — AB (ref 12.0–15.0)
MCH: 26.3 pg (ref 26.0–34.0)
MCHC: 34.9 g/dL (ref 30.0–36.0)
MCV: 75.2 fL — ABNORMAL LOW (ref 78.0–100.0)
PLATELETS: 283 10*3/uL (ref 150–400)
RBC: 4.15 MIL/uL (ref 3.87–5.11)
RDW: 15.1 % (ref 11.5–15.5)
WBC: 8 10*3/uL (ref 4.0–10.5)

## 2016-11-04 LAB — PROTEIN / CREATININE RATIO, URINE
CREATININE, URINE: 101 mg/dL
PROTEIN CREATININE RATIO: 0.12 mg/mg{creat} (ref 0.00–0.15)
TOTAL PROTEIN, URINE: 12 mg/dL

## 2016-11-04 LAB — OB RESULTS CONSOLE GBS
GBS: POSITIVE
GBS: POSITIVE
STREP GROUP B AG: NEGATIVE

## 2016-11-04 MED ORDER — PENICILLIN G POT IN DEXTROSE 60000 UNIT/ML IV SOLN
3.0000 10*6.[IU] | INTRAVENOUS | Status: DC
  Administered 2016-11-04 – 2016-11-06 (×11): 3 10*6.[IU] via INTRAVENOUS
  Filled 2016-11-04 (×14): qty 50

## 2016-11-04 MED ORDER — OXYCODONE-ACETAMINOPHEN 5-325 MG PO TABS: 1.0000 | ORAL_TABLET | ORAL | Status: DC | PRN

## 2016-11-04 MED ORDER — TERBUTALINE SULFATE 1 MG/ML IJ SOLN
0.2500 mg | Freq: Once | INTRAMUSCULAR | Status: DC | PRN
  Filled 2016-11-04: qty 1

## 2016-11-04 MED ORDER — SOD CITRATE-CITRIC ACID 500-334 MG/5ML PO SOLN
30.0000 mL | ORAL | Status: DC | PRN
  Administered 2016-11-06: 30 mL via ORAL
  Filled 2016-11-04: qty 15

## 2016-11-04 MED ORDER — LACTATED RINGERS IV SOLN: 500.0000 mL | INTRAVENOUS | Status: DC | PRN

## 2016-11-04 MED ORDER — OXYTOCIN BOLUS FROM INFUSION: 500.0000 mL | Freq: Once | INTRAVENOUS | Status: DC

## 2016-11-04 MED ORDER — ONDANSETRON HCL 4 MG/2ML IJ SOLN: 4.0000 mg | Freq: Four times a day (QID) | INTRAMUSCULAR | Status: DC | PRN

## 2016-11-04 MED ORDER — OXYTOCIN 40 UNITS IN LACTATED RINGERS INFUSION - SIMPLE MED: 2.5000 [IU]/h | INTRAVENOUS | Status: DC

## 2016-11-04 MED ORDER — OXYCODONE-ACETAMINOPHEN 5-325 MG PO TABS: 2.0000 | ORAL_TABLET | ORAL | Status: DC | PRN

## 2016-11-04 MED ORDER — FENTANYL CITRATE (PF) 100 MCG/2ML IJ SOLN: 100.0000 ug | INTRAMUSCULAR | Status: DC | PRN

## 2016-11-04 MED ORDER — FLEET ENEMA 7-19 GM/118ML RE ENEM: 1.0000 | ENEMA | RECTAL | Status: DC | PRN

## 2016-11-04 MED ORDER — LIDOCAINE HCL (PF) 1 % IJ SOLN: 30.0000 mL | INTRAMUSCULAR | Status: DC | PRN

## 2016-11-04 MED ORDER — DEXTROSE 5 % IV SOLN
5.0000 10*6.[IU] | Freq: Once | INTRAVENOUS | Status: AC
  Administered 2016-11-04: 5 10*6.[IU] via INTRAVENOUS
  Filled 2016-11-04: qty 5

## 2016-11-04 MED ORDER — ACETAMINOPHEN 325 MG PO TABS: 650.0000 mg | ORAL_TABLET | ORAL | Status: DC | PRN

## 2016-11-04 MED ORDER — MISOPROSTOL 25 MCG QUARTER TABLET
25.0000 ug | ORAL_TABLET | ORAL | Status: DC | PRN
  Administered 2016-11-04 (×2): 25 ug via VAGINAL
  Filled 2016-11-04 (×2): qty 1

## 2016-11-04 MED ORDER — LACTATED RINGERS IV SOLN
INTRAVENOUS | Status: DC
  Administered 2016-11-04 – 2016-11-06 (×3): via INTRAVENOUS

## 2016-11-04 NOTE — Anesthesia Pain Management Evaluation Note (Signed)
  CRNA Pain Management Visit Note  Patient: Kayla [name], 31 y.o., female  "Hello I am a member of the anesthesia team at [facility]. We have an anesthesia team available at all times to provide care throughout the hospital, including epidural management and anesthesia for C-section. I don't know your plan for the delivery whether it a natural birth, water birth, IV sedation, nitrous supplementation, doula or epidural, but we want to meet your pain goals."   1.Was your pain managed to your expectations on prior hospitalizations?   No prior hospitalizations  2.What is your expectation for pain management during this hospitalization?     Labor support without medications, Epidural, IV pain meds and Nitrous Oxide  3.How can we help you reach that goal? Open to discussion about all pain control methods  Record the patient's initial score and the patient's pain goal.   Pain: 2  Pain Goal: 10 [facility] wants you to be able to say your pain was always managed very well.  Kayla [name] 11/04/2016
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
## 2016-11-04 NOTE — Progress Notes (Signed)
Patient reports good fetal movement, denies contractions. 

## 2016-11-04 NOTE — H&P (Signed)
LABOR AND DELIVERY ADMISSION HISTORY AND PHYSICAL NOTE  Jeilyn Etheleen MayhewLeach is a 31 y.o. female G3P0020 with IUP at 1330w6d by LMP presenting for gestational HTN and induction of labor.  She endorses She was found to be hypertensive to 157/89 at clinic visit today prompting admission. She reports +FM, + contractions, No LOF, no VB, no blurry vision, headaches or peripheral edema, and RUQ pain.  She plans on breast  feeding. She favors OCP as post partum contraception  She reports positive fetal movement. She denies leakage of fluid or vaginal bleeding.  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 FOB has genital herpes diagnosed ~7 years ago with active lesions at that time, FOB has not had any known outbreaks during this pregnancy.  Patient denies ever having active genital lesions, and she states she was diagnosed via blood test. She has certainly been exposed to HSV. Acyclovir 400 mg TID was initiated at 36 weeks. Per active med list, valcyclovir 1000 mg tablets BID were ordered, however patient endorses taking a syrup due to poor tolerability of the tablets with nausea.    Prenatal History/Complications:  Past Medical History: Past Medical History:  Diagnosis Date  . HSV (herpes simplex virus) infection     Past Surgical History: No past surgical history on file.  Obstetrical History: OB History    Gravida Para Term Preterm AB Living   3 0     2     SAB TAB Ectopic Multiple Live Births     2            Social History: Social History   Social History  . Marital status: Single    Spouse name: N/A  . Number of children: N/A  . Years of education: N/A   Occupational History  . Customer Care Analyst    Social History Main Topics  . Smoking status: Former Games developermoker  . Smokeless tobacco: Former NeurosurgeonUser  . Alcohol use No  . Drug use: No  . Sexual activity: Yes   Other Topics Concern  . Not on file   Social History Narrative   Single. Education: [employer]. Exercise: Yes.      Family History: Family History  Problem Relation Age of Onset  . Heart disease Father   . Cancer Maternal Grandmother        leukemia  . Diabetes Maternal Grandmother   . Hyperlipidemia Maternal Grandfather   . Heart disease Paternal Grandmother     Allergies: No Known Allergies  Prescriptions Prior to Admission  Medication Sig Dispense Refill Last Dose  . Prenat-FeAsp-Meth-FA-DHA w/o A (PRENATE PIXIE) 10-0.6-0.4-200 MG CAPS Take 1 tablet by mouth daily. 30 capsule 12 Taking  . valACYclovir (VALTREX) 1000 MG tablet Take 1 tablet (1,000 mg total) by mouth 2 (two) times daily. 20 tablet 0 Taking     Review of Systems   All systems reviewed and negative except as stated in HPI  Physical Exam Last menstrual period 02/06/2016. General appearance: alert, cooperative, appears stated age and no distress Lungs: clear to auscultation bilaterally Heart: regular rate and rhythm Abdomen: soft, non-tender; bowel sounds normal Extremities: No calf swelling or tenderness GU: Labia minora and majora normal in appearance without lesions, cervix visualized with speculum exam, no active lesions appreciated internally or over cervix. +thick white discharge appreciated and wiped away for exam Fetal monitoring: HR 140, moderate variability, accels, no decels Uterine activity: +irritability with occasional contractions   Prenatal labs: ABO, Rh: O/Positive/-- (10/30 1505) Antibody: Negative (10/30  1505) Rubella: Immune RPR: Non Reactive (03/06 1100)  HBsAg: Negative (10/30 1505)  HIV: Non Reactive (03/06 1100)  GBS:   POSITIVE 2 hr Glucola: WNL Genetic screening:  AFP neg, NIPS/Mat 21 normal  Prenatal Transfer Tool  Maternal Diabetes: No Genetic Screening: Normal Maternal Ultrasounds/Referrals: Normal Fetal Ultrasounds or other Referrals:  None Maternal Substance Abuse:  No Significant Maternal Medications:  Meds include: Other:  Valtrex 1000 mg BID Significant Maternal Lab  Results: Lab values include: Group B Strep positive, HSV+  No results found for this or any previous visit (from the past 24 hour(s)).  Patient Active Problem List   Diagnosis Date Noted  . HSV (herpes simplex virus) infection 09/22/2016  . Traumatic injury during pregnancy in second trimester 05/17/2016  . Positive GBS test 04/26/2016  . Supervision of normal pregnancy, antepartum 04/19/2016  . History of herpes genitalis 04/19/2016    Assessment: Binnie Sammon is a 31 y.o. G3P0020 at [redacted]w[redacted]d here for gestational HTN and induction of labor. Denies headaches or blurry vision.  #Labor: cytotec 25 mcg per vagina q4H; FB placed with ease #Pain: Well controlled with tylenol, fentanyl, percocet #FWB: Category 1 #ID: GBS+, HSV+ per blood test (see HPI) #MOF: breast feed #MOC:prefers OCP, considering long-term options  Howard Pouch, MD PGY-1 [facility] 11/04/2016, 1:38 PM   OB FELLOW HISTORY AND PHYSICAL ATTESTATION  I have seen and examined this patient; I agree with above documentation in the resident's note. I personally checked the patient via speculum exam, no lesions noted on external genitalia, vaginal mucosa, cervix, or rectum. Husband has known genital HSV. Per patient she has not had any lesion outbreaks this pregnancy (occasionally she mentions she will feel burning when she washes with soap down there, and that's what she thought were outbreaks).   Jen Mow, DO OB Fellow 11/04/2016, 6:29 PM

## 2016-11-04 NOTE — Progress Notes (Signed)
Labor Progress Note  Kayla [name] is a 31 y.o. G3P0020 at 6250w6d  admitted for induction of labor due to Hypertension.  S: Patient doing well. Reporting painful contractions and is planning to receive epidural.    O:  BP 131/82 (BP Location: Left Arm)   Pulse 100   Temp 97.5 F (36.4 C) (Axillary)   Resp 17   Ht 5' 2.5" (1.588 m)   Wt 252 lb 4.8 oz (114.4 kg)   LMP 02/06/2016 (Approximate)   BMI 45.41 kg/m   No intake/output data recorded.  FHT:  FHR: 145 bpm, variability: moderate,  accelerations:  Present,  decelerations:  Absent UC:   regular, every 4 minutes SVE:   Dilation: 1.5 Effacement (%): 10 Station: -3 Exam by:: Mary SwazilandJordan Johnson, RN  SROM/AROM: Membranes intact  Labs: Lab Results  Component Value Date   WBC 8.0 11/04/2016   HGB 10.9 (L) 11/04/2016   HCT 31.2 (L) 11/04/2016   MCV 75.2 (L) 11/04/2016   PLT 283 11/04/2016    Assessment / Plan: 31 y.o. G3P0020 3250w6d in active labor Induction of labor due to gestational HTN. Progressing well with cytotec.  Patient hoping for water birth. Discussed that while patient is receiving cytotec and pit, she needs to be on monitor, so water birth is not possibility. Also discussed possibility of stopping pitocin if patient begins to progress without it, in which case water birth would be possible. Assured patient we will work with her to make water birth happen if at all possible.   Labor: Progressing normally Fetal Wellbeing:  Category I Pain Control:  Epidural and IV pain meds Anticipated MOD:  NSVD  Expectant management   Tarri AbernethyAbigail J Lancaster, MD, MPH PGY-2 [facility]

## 2016-11-05 LAB — TYPE AND SCREEN
ABO/RH(D): O POS
ANTIBODY SCREEN: NEGATIVE

## 2016-11-05 LAB — RPR: RPR Ser Ql: NONREACTIVE

## 2016-11-05 MED ORDER — SODIUM CHLORIDE 0.9% FLUSH
3.0000 mL | INTRAVENOUS | Status: DC | PRN
  Administered 2016-11-05: 3 mL via INTRAVENOUS
  Filled 2016-11-05: qty 3

## 2016-11-05 MED ORDER — OXYTOCIN 40 UNITS IN LACTATED RINGERS INFUSION - SIMPLE MED
1.0000 m[IU]/min | INTRAVENOUS | Status: DC
  Administered 2016-11-05: 2 m[IU]/min via INTRAVENOUS
  Filled 2016-11-05 (×2): qty 1000

## 2016-11-05 MED ORDER — MISOPROSTOL 50MCG HALF TABLET
50.0000 ug | ORAL_TABLET | ORAL | Status: DC
  Administered 2016-11-05 (×4): 50 ug via BUCCAL
  Filled 2016-11-05 (×4): qty 1

## 2016-11-05 MED ORDER — TERBUTALINE SULFATE 1 MG/ML IJ SOLN
0.2500 mg | Freq: Once | INTRAMUSCULAR | Status: AC | PRN
  Administered 2016-11-06: 0.25 mg via SUBCUTANEOUS

## 2016-11-05 NOTE — Progress Notes (Signed)
Labor Progress Note  Latica Etheleen MayhewLeach is a 31 y.o. G3P0020 at 2324w6d  admitted for induction of labor due to Hypertension.  S: Sleeping comfortably. Received third dose of cytotec about two hours ago.   O:  BP 131/82 (BP Location: Left Arm)   Pulse 100   Temp 97.5 F (36.4 C) (Axillary)   Resp 17   Ht 5' 2.5" (1.588 m)   Wt 252 lb 4.8 oz (114.4 kg)   LMP 02/06/2016 (Approximate)   BMI 45.41 kg/m   No intake/output data recorded.  FHT:  FHR: 130 bpm, variability: minimal ,  accelerations:  Present,  decelerations:  Absent UC:   Irregular SVE:   Dilation: 4 Effacement (%): Thick Station: Ballotable Exam by:: katie forsell,rnc SROM/AROM: Membranes intact  Labs: Lab Results  Component Value Date   WBC 8.0 11/04/2016   HGB 10.9 (L) 11/04/2016   HCT 31.2 (L) 11/04/2016   MCV 75.2 (L) 11/04/2016   PLT 283 11/04/2016    Assessment / Plan: 31 y.o. G3P0020 2224w6d in active labor Induction of labor due to gestational HTN. Progressing well with cytotec.  Patient hoping for water birth. Discussed that while patient is receiving cytotec and pit, she needs to be on monitor, so water birth is not possibility. Also discussed possibility of stopping pitocin if patient begins to progress without it, in which case water birth would be possible. Assured patient we will work with her to make water birth happen if at all possible.   Labor: Progressing normally Fetal Wellbeing:  Category I Pain Control:  Epidural and IV pain meds Anticipated MOD:  NSVD  Expectant management   Tarri AbernethyAbigail J Aidon Klemens, MD, MPH PGY-2 [facility]

## 2016-11-05 NOTE — Progress Notes (Addendum)
Patient ID: Tawanna SatHope Peth, female   DOB: 04/17/1986, 31 y.o.   MRN: [id number]  Pit started recently; doing well; not feeling cramping yet; desires waterbirth if labor allows  BP 136/87, other VSS FHR 145-150s, +accels, no decels, Cat 1 Irreg mild ctx w/ Pit @ 636mu/min Cx deferred (was 3-4/60-70/-3)  IUP@term  gHTN Cx favorable  Will continue to titrate Pit to active labor Plan to keep Pit going until approx 7cm, then may AROM and turn Pit off to try to attempt waterbirth if possible; pt understands this may not work  Cam HaiSHAW, KIMBERLY CNM 11/05/2016 9:24 PM

## 2016-11-05 NOTE — Progress Notes (Signed)
Patient ID: Kayla [name], female   DOB: 05/30/1986, 31 y.o.   MRN: [id number]  S: Patient seen & examined for progress of labor. Patient comfortable in bed, just finished showering and eating dinner.    O:  Vitals:   11/05/16 1504 11/05/16 1610 11/05/16 1658 11/05/16 1843  BP: 125/76 105/80  136/87  Pulse: 88 97  98  Resp: 18 20 20 18   Temp:  97.7 F (36.5 C)    TempSrc:  Oral    Weight:      Height:        Dilation: 3.5 Effacement (%): 60, 70 Cervical Position: Posterior Station: -3 Presentation: Vertex Exam by:: Zaid Tomes, MD   FHT: 130bpm, mod var, +accels, no decels TOCO: Irregular   A/P: Starting pitocin Continue expectant management Anticipate SVD

## 2016-11-05 NOTE — Progress Notes (Signed)
S: Patient seen & examined for progress of labor. Patient comfortable with epidural. Denies N/V. Reports urinating normally, last BM yesterday.   O:  Vitals:   11/05/16 0657 11/05/16 0659 11/05/16 0812 11/05/16 0915  BP:  126/86 123/86   Pulse:  85 85   Resp:   20 20  Temp: 97.8 F (36.6 C)  98.2 F (36.8 C)   TempSrc: Oral  Oral   Weight:      Height:        Dilation: 4 Effacement (%): Thick Cervical Position: Posterior Station: -3 Presentation: Vertex Exam by:: Heather Hogan,CNM   FHT: 150 bpm, mod var, +accels, no decels TOCO: q643min  A/P: 30yo  G3P0020 at [redacted]w[redacted]d p/w GHTN for IOL. S/p foley bulb, 3 total doses of cytotec (two were  buccal doses).   Continue expectant management Anticipate SVD  Labor: Progressing normally Fetal Wellbeing:  Category I Pain Control:  Epidural and IV pain meds Anticipated MOD:  NSVD

## 2016-11-05 NOTE — Progress Notes (Signed)
Kayla [name] is a 31 y.o. G3P0020 at 2740w0d admitted for induction of labor due to Hypertension.  Subjective:   Objective: BP 126/86   Pulse 85   Temp 97.8 F (36.6 C) (Oral)   Resp 17   Ht 5' 2.5" (1.588 m)   Wt 252 lb 4.8 oz (114.4 kg)   LMP 02/06/2016 (Approximate)   BMI 45.41 kg/m  No intake/output data recorded. No intake/output data recorded.  FHT:  FHR: 130 bpm, variability: moderate,  accelerations:  Present,  decelerations:  Absent UC:   irregular, every 10 minutes SVE: 4/thick/-3/vtx  Labs: Lab Results  Component Value Date   WBC 8.0 11/04/2016   HGB 10.9 (L) 11/04/2016   HCT 31.2 (L) 11/04/2016   MCV 75.2 (L) 11/04/2016   PLT 283 11/04/2016    Assessment / Plan: induction of labor, foley bulb out. Still not active.   Labor: latent labor  Preeclampsia:  PIH labs normal on admission  Fetal Wellbeing:  Category I Pain Control:  Labor support without medications I/D:  na Anticipated MOD:  NSVD  Kayla [name] 11/05/2016, 7:20 AM

## 2016-11-06 ENCOUNTER — Encounter (HOSPITAL_COMMUNITY): Payer: Self-pay

## 2016-11-06 ENCOUNTER — Inpatient Hospital Stay (HOSPITAL_COMMUNITY): Payer: Managed Care, Other (non HMO) | Admitting: Anesthesiology

## 2016-11-06 ENCOUNTER — Encounter (HOSPITAL_COMMUNITY)
Admission: AD | Disposition: A | Discharge status: Home / Self Care | Payer: Self-pay | Source: Ambulatory Visit | Attending: Obstetrics and Gynecology

## 2016-11-06 DIAGNOSIS — Z8759 Personal history of other complications of pregnancy, childbirth and the puerperium: Secondary | ICD-10-CM | POA: Diagnosis present

## 2016-11-06 DIAGNOSIS — O329XX0 Maternal care for malpresentation of fetus, unspecified, not applicable or unspecified: Secondary | ICD-10-CM

## 2016-11-06 DIAGNOSIS — O134 Gestational [pregnancy-induced] hypertension without significant proteinuria, complicating childbirth: Secondary | ICD-10-CM

## 2016-11-06 DIAGNOSIS — O99824 Streptococcus B carrier state complicating childbirth: Secondary | ICD-10-CM

## 2016-11-06 LAB — CBC
HEMATOCRIT: 34.4 % — AB (ref 36.0–46.0)
Hemoglobin: 11.7 g/dL — ABNORMAL LOW (ref 12.0–15.0)
MCH: 25.9 pg — AB (ref 26.0–34.0)
MCHC: 34 g/dL (ref 30.0–36.0)
MCV: 76.3 fL — AB (ref 78.0–100.0)
PLATELETS: 242 10*3/uL (ref 150–400)
RBC: 4.51 MIL/uL (ref 3.87–5.11)
RDW: 15.2 % (ref 11.5–15.5)
WBC: 10.6 10*3/uL — ABNORMAL HIGH (ref 4.0–10.5)

## 2016-11-06 SURGERY — Surgical Case
Anesthesia: Regional

## 2016-11-06 MED ORDER — FENTANYL 2.5 MCG/ML BUPIVACAINE 1/10 % EPIDURAL INFUSION (WH - ANES)
14.0000 mL/h | INTRAMUSCULAR | Status: DC | PRN
  Administered 2016-11-06: 14 mL/h via EPIDURAL
  Filled 2016-11-06: qty 100

## 2016-11-06 MED ORDER — SIMETHICONE 80 MG PO CHEW
80.0000 mg | CHEWABLE_TABLET | Freq: Three times a day (TID) | ORAL | Status: DC
  Administered 2016-11-07 (×2): 80 mg via ORAL
  Filled 2016-11-06 (×2): qty 1

## 2016-11-06 MED ORDER — MENTHOL 3 MG MT LOZG: 1.0000 | LOZENGE | OROMUCOSAL | Status: DC | PRN

## 2016-11-06 MED ORDER — MORPHINE SULFATE (PF) 0.5 MG/ML IJ SOLN
INTRAMUSCULAR | Status: AC
  Filled 2016-11-06: qty 10

## 2016-11-06 MED ORDER — FENTANYL CITRATE (PF) 100 MCG/2ML IJ SOLN
INTRAMUSCULAR | Status: AC
  Filled 2016-11-06: qty 2

## 2016-11-06 MED ORDER — FENTANYL CITRATE (PF) 100 MCG/2ML IJ SOLN
INTRAMUSCULAR | Status: DC | PRN
  Administered 2016-11-06: 100 ug via INTRAVENOUS
  Administered 2016-11-06: 100 ug via EPIDURAL

## 2016-11-06 MED ORDER — ZOLPIDEM TARTRATE 5 MG PO TABS: 5.0000 mg | ORAL_TABLET | Freq: Every evening | ORAL | Status: DC | PRN

## 2016-11-06 MED ORDER — PHENYLEPHRINE 40 MCG/ML (10ML) SYRINGE FOR IV PUSH (FOR BLOOD PRESSURE SUPPORT)
80.0000 ug | PREFILLED_SYRINGE | INTRAVENOUS | Status: DC | PRN
  Filled 2016-11-06: qty 10

## 2016-11-06 MED ORDER — LACTATED RINGERS IV SOLN
INTRAVENOUS | Status: DC | PRN
  Administered 2016-11-06: 18:00:00 via INTRAVENOUS

## 2016-11-06 MED ORDER — METOCLOPRAMIDE HCL 5 MG/ML IJ SOLN: 10.0000 mg | Freq: Once | INTRAMUSCULAR | Status: DC | PRN

## 2016-11-06 MED ORDER — LACTATED RINGERS IV SOLN
INTRAVENOUS | Status: DC | PRN
  Administered 2016-11-06 (×2): via INTRAVENOUS

## 2016-11-06 MED ORDER — DIPHENHYDRAMINE HCL 25 MG PO CAPS
25.0000 mg | ORAL_CAPSULE | Freq: Four times a day (QID) | ORAL | Status: DC | PRN
  Administered 2016-11-06: 25 mg via ORAL
  Filled 2016-11-06: qty 1

## 2016-11-06 MED ORDER — COCONUT OIL OIL: 1.0000 "application " | TOPICAL_OIL | Status: DC | PRN

## 2016-11-06 MED ORDER — SIMETHICONE 80 MG PO CHEW
80.0000 mg | CHEWABLE_TABLET | ORAL | Status: DC
  Administered 2016-11-07: 80 mg via ORAL
  Filled 2016-11-06 (×2): qty 1

## 2016-11-06 MED ORDER — MEPERIDINE HCL 25 MG/ML IJ SOLN: 6.2500 mg | INTRAMUSCULAR | Status: DC | PRN

## 2016-11-06 MED ORDER — DIPHENHYDRAMINE HCL 50 MG/ML IJ SOLN: 12.5000 mg | INTRAMUSCULAR | Status: DC | PRN

## 2016-11-06 MED ORDER — LIDOCAINE-EPINEPHRINE (PF) 2 %-1:200000 IJ SOLN
INTRAMUSCULAR | Status: AC
  Filled 2016-11-06: qty 20

## 2016-11-06 MED ORDER — SIMETHICONE 80 MG PO CHEW: 80.0000 mg | CHEWABLE_TABLET | ORAL | Status: DC | PRN

## 2016-11-06 MED ORDER — KETOROLAC TROMETHAMINE 30 MG/ML IJ SOLN
INTRAMUSCULAR | Status: AC
  Filled 2016-11-06: qty 1

## 2016-11-06 MED ORDER — LACTATED RINGERS IV SOLN
500.0000 mL | Freq: Once | INTRAVENOUS | Status: AC
  Administered 2016-11-06: 500 mL via INTRAVENOUS

## 2016-11-06 MED ORDER — MORPHINE SULFATE (PF) 0.5 MG/ML IJ SOLN
INTRAMUSCULAR | Status: DC | PRN
  Administered 2016-11-06: 2 mg via EPIDURAL
  Administered 2016-11-06: 1 mg via EPIDURAL

## 2016-11-06 MED ORDER — BUPIVACAINE HCL (PF) 0.5 % IJ SOLN
INTRAMUSCULAR | Status: AC
  Filled 2016-11-06: qty 30

## 2016-11-06 MED ORDER — OXYCODONE HCL 5 MG PO TABS
10.0000 mg | ORAL_TABLET | ORAL | Status: DC | PRN
  Administered 2016-11-07: 10 mg via ORAL
  Filled 2016-11-06: qty 2

## 2016-11-06 MED ORDER — ONDANSETRON HCL 4 MG/2ML IJ SOLN
INTRAMUSCULAR | Status: DC | PRN
  Administered 2016-11-06: 4 mg via INTRAVENOUS

## 2016-11-06 MED ORDER — ACETAMINOPHEN 325 MG PO TABS: 650.0000 mg | ORAL_TABLET | ORAL | Status: DC | PRN

## 2016-11-06 MED ORDER — CEFAZOLIN SODIUM-DEXTROSE 2-4 GM/100ML-% IV SOLN
2.0000 g | Freq: Three times a day (TID) | INTRAVENOUS | Status: DC
  Filled 2016-11-06 (×5): qty 100

## 2016-11-06 MED ORDER — WITCH HAZEL-GLYCERIN EX PADS: 1.0000 "application " | MEDICATED_PAD | CUTANEOUS | Status: DC | PRN

## 2016-11-06 MED ORDER — OXYTOCIN 10 UNIT/ML IJ SOLN
INTRAMUSCULAR | Status: AC
  Filled 2016-11-06: qty 4

## 2016-11-06 MED ORDER — OXYTOCIN 40 UNITS IN LACTATED RINGERS INFUSION - SIMPLE MED: 2.5000 [IU]/h | INTRAVENOUS | Status: AC

## 2016-11-06 MED ORDER — ONDANSETRON HCL 4 MG/2ML IJ SOLN
INTRAMUSCULAR | Status: AC
  Filled 2016-11-06: qty 2

## 2016-11-06 MED ORDER — LIDOCAINE-EPINEPHRINE (PF) 2 %-1:200000 IJ SOLN
INTRAMUSCULAR | Status: DC | PRN
  Administered 2016-11-06: 2 mL via INTRADERMAL
  Administered 2016-11-06 (×3): 5 mL via INTRADERMAL
  Administered 2016-11-06: 3 mL via INTRADERMAL

## 2016-11-06 MED ORDER — LIDOCAINE HCL (PF) 1 % IJ SOLN
INTRAMUSCULAR | Status: DC | PRN
  Administered 2016-11-06 (×2): 5 mL

## 2016-11-06 MED ORDER — SENNOSIDES-DOCUSATE SODIUM 8.6-50 MG PO TABS
2.0000 | ORAL_TABLET | ORAL | Status: DC
  Administered 2016-11-07: 2 via ORAL
  Filled 2016-11-06 (×2): qty 2

## 2016-11-06 MED ORDER — FENTANYL CITRATE (PF) 100 MCG/2ML IJ SOLN: 25.0000 ug | INTRAMUSCULAR | Status: DC | PRN

## 2016-11-06 MED ORDER — SCOPOLAMINE 1 MG/3DAYS TD PT72
MEDICATED_PATCH | TRANSDERMAL | Status: DC | PRN
  Administered 2016-11-06: 1 via TRANSDERMAL

## 2016-11-06 MED ORDER — LACTATED RINGERS IV SOLN: INTRAVENOUS | Status: DC

## 2016-11-06 MED ORDER — SODIUM CHLORIDE 0.9 % IR SOLN
Status: DC | PRN
  Administered 2016-11-06: 1

## 2016-11-06 MED ORDER — IBUPROFEN 600 MG PO TABS
600.0000 mg | ORAL_TABLET | Freq: Four times a day (QID) | ORAL | Status: DC
  Administered 2016-11-07 – 2016-11-08 (×5): 600 mg via ORAL
  Filled 2016-11-06 (×6): qty 1

## 2016-11-06 MED ORDER — DIBUCAINE 1 % RE OINT: 1.0000 "application " | TOPICAL_OINTMENT | RECTAL | Status: DC | PRN

## 2016-11-06 MED ORDER — TETANUS-DIPHTH-ACELL PERTUSSIS 5-2.5-18.5 LF-MCG/0.5 IM SUSP: 0.5000 mL | Freq: Once | INTRAMUSCULAR | Status: DC

## 2016-11-06 MED ORDER — OXYCODONE HCL 5 MG PO TABS
5.0000 mg | ORAL_TABLET | ORAL | Status: DC | PRN
  Administered 2016-11-07 – 2016-11-08 (×2): 5 mg via ORAL
  Filled 2016-11-06 (×2): qty 1

## 2016-11-06 MED ORDER — PRENATAL MULTIVITAMIN CH
1.0000 | ORAL_TABLET | Freq: Every day | ORAL | Status: DC
  Administered 2016-11-07: 1 via ORAL
  Filled 2016-11-06: qty 1

## 2016-11-06 MED ORDER — CEFAZOLIN SODIUM-DEXTROSE 2-4 GM/100ML-% IV SOLN
INTRAVENOUS | Status: AC
  Filled 2016-11-06: qty 100

## 2016-11-06 MED ORDER — PHENYLEPHRINE 40 MCG/ML (10ML) SYRINGE FOR IV PUSH (FOR BLOOD PRESSURE SUPPORT): 80.0000 ug | PREFILLED_SYRINGE | INTRAVENOUS | Status: DC | PRN

## 2016-11-06 MED ORDER — BUPIVACAINE HCL (PF) 0.5 % IJ SOLN
INTRAMUSCULAR | Status: DC | PRN
  Administered 2016-11-06: 30 mL

## 2016-11-06 MED ORDER — OXYTOCIN 10 UNIT/ML IJ SOLN
INTRAVENOUS | Status: DC | PRN
  Administered 2016-11-06: 40 [IU] via INTRAVENOUS

## 2016-11-06 MED ORDER — EPHEDRINE 5 MG/ML INJ: 10.0000 mg | INTRAVENOUS | Status: DC | PRN

## 2016-11-06 MED ORDER — SCOPOLAMINE 1 MG/3DAYS TD PT72
MEDICATED_PATCH | TRANSDERMAL | Status: AC
  Filled 2016-11-06: qty 1

## 2016-11-06 MED ORDER — CEFAZOLIN SODIUM-DEXTROSE 2-3 GM-% IV SOLR
INTRAVENOUS | Status: DC | PRN
  Administered 2016-11-06: 2 g via INTRAVENOUS

## 2016-11-06 SURGICAL SUPPLY — 48 items
APL SKNCLS STERI-STRIP NONHPOA (GAUZE/BANDAGES/DRESSINGS) ×1
BENZOIN TINCTURE PRP APPL 2/3 (GAUZE/BANDAGES/DRESSINGS) ×3 IMPLANT
CHLORAPREP W/TINT 26ML (MISCELLANEOUS) ×3 IMPLANT
CLAMP CORD UMBIL (MISCELLANEOUS) IMPLANT
CLOSURE WOUND 1/2 X4 (GAUZE/BANDAGES/DRESSINGS) ×1
CLOTH BEACON ORANGE TIMEOUT ST (SAFETY) ×3 IMPLANT
DECANTER SPIKE VIAL GLASS SM (MISCELLANEOUS) ×2 IMPLANT
DRAPE C SECTION CLR SCREEN (DRAPES) ×3 IMPLANT
DRSG OPSITE POSTOP 4X10 (GAUZE/BANDAGES/DRESSINGS) ×3 IMPLANT
ELECT REM PT RETURN 9FT ADLT (ELECTROSURGICAL) ×3
ELECTRODE REM PT RTRN 9FT ADLT (ELECTROSURGICAL) ×1 IMPLANT
EXTRACTOR VACUUM M CUP 4 TUBE (SUCTIONS) IMPLANT
EXTRACTOR VACUUM M CUP 4' TUBE (SUCTIONS)
GLOVE BIO SURGEON STRL SZ7.5 (GLOVE) ×3 IMPLANT
GLOVE BIO SURGEON STRL SZ8 (GLOVE) ×2 IMPLANT
GLOVE BIOGEL PI IND STRL 7.0 (GLOVE) ×1 IMPLANT
GLOVE BIOGEL PI INDICATOR 7.0 (GLOVE) ×2
GOWN STRL REUS W/TWL 2XL LVL3 (GOWN DISPOSABLE) ×3 IMPLANT
GOWN STRL REUS W/TWL LRG LVL3 (GOWN DISPOSABLE) ×6 IMPLANT
HOVERMATT SINGLE USE (MISCELLANEOUS) ×2 IMPLANT
KIT ABG SYR 3ML LUER SLIP (SYRINGE) IMPLANT
NDL HYPO 25X5/8 SAFETYGLIDE (NEEDLE) IMPLANT
NEEDLE HYPO 22GX1.5 SAFETY (NEEDLE) ×3 IMPLANT
NEEDLE HYPO 25X5/8 SAFETYGLIDE (NEEDLE) IMPLANT
NS IRRIG 1000ML POUR BTL (IV SOLUTION) ×3 IMPLANT
PACK C SECTION WH (CUSTOM PROCEDURE TRAY) ×3 IMPLANT
PAD OB MATERNITY 4.3X12.25 (PERSONAL CARE ITEMS) ×3 IMPLANT
PENCIL SMOKE EVAC W/HOLSTER (ELECTROSURGICAL) ×3 IMPLANT
RTRCTR C-SECT PINK 25CM LRG (MISCELLANEOUS) ×3 IMPLANT
SPONGE LAP 18X18 X RAY DECT (DISPOSABLE) ×3 IMPLANT
STRIP CLOSURE SKIN 1/2X4 (GAUZE/BANDAGES/DRESSINGS) ×2 IMPLANT
SUT CHROMIC 1 CTX 36 (SUTURE) ×8 IMPLANT
SUT PLAIN 2 0 (SUTURE) ×3
SUT PLAIN ABS 2-0 CT1 27XMFL (SUTURE) IMPLANT
SUT VIC AB 1 CT1 27 (SUTURE) ×6
SUT VIC AB 1 CT1 27XBRD ANTBC (SUTURE) ×2 IMPLANT
SUT VIC AB 2-0 CT1 (SUTURE) ×3 IMPLANT
SUT VIC AB 2-0 CT1 27 (SUTURE) ×3
SUT VIC AB 2-0 CT1 TAPERPNT 27 (SUTURE) ×1 IMPLANT
SUT VIC AB 3-0 CT1 27 (SUTURE) ×6
SUT VIC AB 3-0 CT1 TAPERPNT 27 (SUTURE) ×2 IMPLANT
SUT VIC AB 3-0 SH 27 (SUTURE)
SUT VIC AB 3-0 SH 27X BRD (SUTURE) IMPLANT
SUT VIC AB 4-0 KS 27 (SUTURE) ×3 IMPLANT
SYR BULB IRRIGATION 50ML (SYRINGE) ×3 IMPLANT
SYR CONTROL 10ML LL (SYRINGE) ×3 IMPLANT
TOWEL OR 17X24 6PK STRL BLUE (TOWEL DISPOSABLE) ×3 IMPLANT
TRAY FOLEY BAG SILVER LF 14FR (SET/KITS/TRAYS/PACK) ×3 IMPLANT

## 2016-11-06 NOTE — Op Note (Signed)
Cesarean Section Procedure Note  11/04/2016 - 11/06/2016  6:40 PM  PATIENT:  Kayla [name]  31 y.o. female  PRE-OPERATIVE DIAGNOSIS:  primary cesarean; malpresentation  POST-OPERATIVE DIAGNOSIS:  primary cesarean; malpresentation  PROCEDURE:  Procedure(s): CESAREAN SECTION (N/A)  SURGEON:  Surgeon(s) and Role:    * Ervin, Marolyn HammockMichael L, MD     * Lorne SkeensSchenk, Nicholas Michael, MD  ASSISTANTS: none   ANESTHESIA:   epidural  EBL:  Total I/O In: 1200 [I.V.:1200] Out: 950 [Urine:450; Blood:500]  BLOOD ADMINISTERED:none  DRAINS: none   LOCAL MEDICATIONS USED:  BUPIVICAINE   SPECIMEN:  No Specimen  DISPOSITION OF SPECIMEN:  N/A   Procedure Details   The patient was seen in the Holding Room. The risks, benefits, complications, treatment options, and expected outcomes were discussed with the patient.  The patient concurred with the proposed plan, giving informed consent.  The site of surgery properly noted/marked. The patient was taken to Operating Room # 9, [facility] and the procedure verified as C-Section Delivery. A Time Out was held and the above information confirmed.  After induction of anesthesia, the patient was draped and prepped in the usual sterile manner. A Pfannenstiel incision was made and carried down through the subcutaneous tissue to the fascia. Fascial incision was made and extended transversely. The fascia was separated from the underlying rectus tissue superiorly and inferiorly. The peritoneum was identified and entered. Peritoneal incision was extended longitudinally. A low transverse uterine incision was made. Delivered from breech frank presentation was a Female with Apgar scores of 8 at one minute and 9 at five minutes. After the umbilical cord was clamped and cut cord blood was obtained for evaluation. The placenta was removed intact and appeared normal. The uterine outline, tubes and ovaries appeared normal. The uterine incision was closed with running  locked sutures of 0 chromic. A second locked imbricating of 0-chromic was placed. Hemostasis was observed. Lavage was carried out until clear. The fascia was then reapproximated with running sutures of Vicryl. The skin was reapproximated with Vicryl.  Instrument, sponge, and needle counts were correct prior the abdominal closure and at the conclusion of the case.   Complications:  None; patient tolerated the procedure well.  COUNTS:  YES  PLAN OF CARE: Admit to inpatient   PATIENT DISPOSITION:  PACU - hemodynamically stable.   Delay start of Pharmacological VTE agent (>24hrs) due to surgical blood loss or risk of bleeding: not applicable             Disposition: PACU - hemodynamically stable.         Condition: stable   Lorne SkeensSchenk, Nicholas Michael, MD 11/06/2016 6:40 PM

## 2016-11-06 NOTE — Consult Note (Signed)
[facility]  Delivery Note:  C-section       11/06/2016  5:47 PM  I was called to the operating room at the request of the patient's obstetrician (Dr. Alysia PennaErvin) for a primary c-section due to breech presentation.  PRENATAL HX:  This is a 31 y/o G3P0020 at 1639 and 1/[redacted] weeks gestation who was admitted on 5/17 for IOL due to increasing blood pressure. Fetus found to be in breech position this morning and external version not successful, so delivered by c-section.  ROM was at the time of delivery, she was GBS positive and received adequate treatment.  She is also HSV + and was on valtrex suppression.   DELIVERY:  Cord clamping delayed for 1 minutes.  Infant was vigorous at delivery, requiring no resuscitation other than standard warming, drying and stimulation.  APGARs 8 and 9.  Exam within normal limits.  After 5 minutes, baby left with nurse to assist parents with skin-to-skin care.   _____________________ Electronically Signed By: Maryan CharLindsey Jaskirat Schwieger, MD Neonatologist

## 2016-11-06 NOTE — Progress Notes (Signed)
Subjective: Patient seen and assessed for labor progress. Patient sleeping soundly, not feeling contractions.   Objective: Vitals:   11/05/16 2140 11/05/16 2304 11/06/16 0100 11/06/16 0354  BP: 137/87 124/64 125/71 113/73  Pulse: 94 83 80 85  Resp: 16 16 16 16   Temp:      TempSrc:      Weight:      Height:        FHT:  FHR: 130 bpm, variability: moderate,  accelerations:  Present,  decelerations:  Absent UC:   irregular, every 5-8 minutes SVE:   Dilation: 3.5 Effacement (%): 60, 70 Station: -3 Exam by:: Mumaw, MD Pitocin @ 22 mu/min  Assessment / Plan: IUP at term. IOL for GHTN. GBS pos.  Stop pitocin and allow patient to eat and shower. Plan to restart pitocin in 3 hours.   Kayla [name] SNM 11/06/2016, 7:07 AM

## 2016-11-06 NOTE — Anesthesia Preprocedure Evaluation (Signed)
Anesthesia Evaluation  Patient identified by MRN, date of birth, ID band Patient awake    Reviewed: Allergy & Precautions, H&P , NPO status , Patient's Chart, lab work & pertinent test results  History of Anesthesia Complications Negative for: history of anesthetic complications  Airway Mallampati: II  TM Distance: >3 FB Neck ROM: full    Dental no notable dental hx. (+) Teeth Intact   Pulmonary neg pulmonary ROS, former smoker,    Pulmonary exam normal breath sounds clear to auscultation       Cardiovascular negative cardio ROS Normal cardiovascular exam Rhythm:regular Rate:Normal     Neuro/Psych negative neurological ROS  negative psych ROS   GI/Hepatic negative GI ROS, Neg liver ROS,   Endo/Other  Morbid obesity  Renal/GU negative Renal ROS  negative genitourinary   Musculoskeletal   Abdominal   Peds  Hematology negative hematology ROS (+)   Anesthesia Other Findings   Reproductive/Obstetrics (+) Pregnancy                             Anesthesia Physical Anesthesia Plan  ASA: III  Anesthesia Plan: Epidural   Post-op Pain Management:    Induction:   Airway Management Planned:   Additional Equipment:   Intra-op Plan:   Post-operative Plan:   Informed Consent: I have reviewed the patients History and Physical, chart, labs and discussed the procedure including the risks, benefits and alternatives for the proposed anesthesia with the patient or authorized representative who has indicated his/her understanding and acceptance.     Plan Discussed with:   Anesthesia Plan Comments:         Anesthesia Quick Evaluation

## 2016-11-06 NOTE — Progress Notes (Signed)
LABOR PROGRESS NOTE  Kayla [name] is a 31 y.o. G3P0020 at 515w1d  admitted for IOL for gHTN  Subjective: Patient with prolonged labor course. Has been here since 5/17. Has cytotec and foley bulb and then was on pitocin. Had pit break with shower and breakfast this AM.    Objective: BP 121/79   Pulse 93   Temp 97.7 F (36.5 C) (Oral)   Resp 16   Ht 5' 2.5" (1.588 m)   Wt 252 lb 4.8 oz (114.4 kg)   LMP 02/06/2016 (Approximate)   BMI 45.41 kg/m  or  Vitals:   11/06/16 0100 11/06/16 0354 11/06/16 0700 11/06/16 0723  BP: 125/71 113/73 124/73 121/79  Pulse: 80 85 83 93  Resp: 16 16 16 16   Temp:      TempSrc:      Weight:      Height:        Dilation: 4 Effacement (%): 50 Cervical Position: Posterior Station: -3 Presentation: Double Footling Breech (by ultrasound) Exam by:: dr Genevie Annschenk  Labs: Lab Results  Component Value Date   WBC 8.0 11/04/2016   HGB 10.9 (L) 11/04/2016   HCT 31.2 (L) 11/04/2016   MCV 75.2 (L) 11/04/2016   PLT 283 11/04/2016    Patient Active Problem List   Diagnosis Date Noted  . Gestational hypertension 11/06/2016  . HSV (herpes simplex virus) infection 09/22/2016  . Traumatic injury during pregnancy in second trimester 05/17/2016  . Positive GBS test 04/26/2016  . Supervision of normal pregnancy, antepartum 04/19/2016  . History of herpes genitalis 04/19/2016    Assessment / Plan: 31 y.o. G3P0020 at [redacted]w[redacted]d here for IOL for gHTN  Labor:  Patient found to be breech on US. Offered patient a version versus c-section. She opted for version. Will get epidural and then attempt. Fetal Wellbeing:  Category 1 Pain Control:   Getting epidrual now. Anticipated MOD:  SVD versus PLTCS  Ernestina PennaNicholas Schenk, MD 11/06/2016, 10:21 AM

## 2016-11-06 NOTE — Procedures (Signed)
External Cephalic Version  Patient was found to be in breech presentation with back down.  After informed verbal consent, Terbutaline 0.25 mg SQ given, ECV was attempted under Ultrasound guidance.  2 attempts to rotate to the maternal left attempted, 2 attempts to rotate to maternal right attempted and all failed.    FHR was reactive before and after the procedure.   Pt. Tolerated the procedure well.   Plan for c-section after 8 hours NPO.  The risks of cesarean section were discussed with the patient including but were not limited to: bleeding which may require transfusion or reoperation; infection which may require antibiotics; injury to bowel, bladder, ureters or other surrounding organs; injury to the fetus; need for additional procedures including hysterectomy in the event of a life-threatening hemorrhage; placental abnormalities wth subsequent pregnancies, incisional problems, thromboembolic phenomenon and other postoperative/anesthesia complications.  Patient has been NPO since 930AM she will remain NPO for procedure. Anesthesia and OR aware.  Preoperative prophylactic antibiotics and SCDs ordered on call to the OR.  To OR when ready.  Ernestina PennaNicholas Schenk, MD 11/06/16 1:22 PM  I was present during procedure and attempted version myself which was unsuccessful. C section recommended to pt. R/B/Post op care reviewed. Scheduled for later today due to pt eating this morning.  Nettie ElmMichael Shaleen Talamantez, MD

## 2016-11-06 NOTE — Anesthesia Procedure Notes (Signed)
Epidural Patient location during procedure: OB  Staffing Anesthesiologist: Finian Helvey Performed: anesthesiologist   Preanesthetic Checklist Completed: patient identified, site marked, surgical consent, pre-op evaluation, timeout performed, IV checked, risks and benefits discussed and monitors and equipment checked  Epidural Patient position: sitting Prep: DuraPrep Patient monitoring: heart rate, continuous pulse ox and blood pressure Approach: right paramedian Location: L3-L4 Injection technique: LOR saline  Needle:  Needle type: Tuohy  Needle gauge: 17 G Needle length: 9 cm and 9 Needle insertion depth: 7 cm Catheter type: closed end flexible Catheter size: 20 Guage Catheter at skin depth: 12 cm Test dose: negative  Assessment Events: blood not aspirated, injection not painful, no injection resistance, negative IV test and no paresthesia  Additional Notes Patient identified. Risks/Benefits/Options discussed with patient including but not limited to bleeding, infection, nerve damage, paralysis, failed block, incomplete pain control, headache, blood pressure changes, nausea, vomiting, reactions to medication both or allergic, itching and postpartum back pain. Confirmed with bedside nurse the patient's most recent platelet count. Confirmed with patient that they are not currently taking any anticoagulation, have any bleeding history or any family history of bleeding disorders. Patient expressed understanding and wished to proceed. All questions were answered. Sterile technique was used throughout the entire procedure. Please see nursing notes for vital signs. Test dose was given through epidural needle and negative prior to continuing to dose epidural or start infusion. Warning signs of high block given to the patient including shortness of breath, tingling/numbness in hands, complete motor block, or any concerning symptoms with instructions to call for help. Patient was given  instructions on fall risk and not to get out of bed. All questions and concerns addressed with instructions to call with any issues.     

## 2016-11-06 NOTE — Transfer of Care (Signed)
Immediate Anesthesia Transfer of Care Note  Patient: Kayla [name]  Procedure(s) Performed: Procedure(s): CESAREAN SECTION (N/A)  Patient Location: PACU  Anesthesia Type:Epidural  Level of Consciousness: awake  Airway & Oxygen Therapy: Patient Spontanous Breathing  Post-op Assessment: Report given to RN and Post -op Vital signs reviewed and stable  Post vital signs: stable  Last Vitals:  Vitals:   11/06/16 1630 11/06/16 1849  BP: 131/61 128/87  Pulse: (!) 114 (!) 104  Resp: 18 18  Temp:  36.8 C    Last Pain:  Vitals:   11/06/16 1849  TempSrc: Oral  PainSc:          Complications: No apparent anesthesia complications

## 2016-11-07 ENCOUNTER — Encounter (HOSPITAL_COMMUNITY): Payer: Self-pay | Admitting: Obstetrics and Gynecology

## 2016-11-07 LAB — CBC
HEMATOCRIT: 27.9 % — AB (ref 36.0–46.0)
HEMOGLOBIN: 9.8 g/dL — AB (ref 12.0–15.0)
MCH: 26.8 pg (ref 26.0–34.0)
MCHC: 35.1 g/dL (ref 30.0–36.0)
MCV: 76.2 fL — AB (ref 78.0–100.0)
Platelets: 218 10*3/uL (ref 150–400)
RBC: 3.66 MIL/uL — ABNORMAL LOW (ref 3.87–5.11)
RDW: 15.3 % (ref 11.5–15.5)
WBC: 10.8 10*3/uL — ABNORMAL HIGH (ref 4.0–10.5)

## 2016-11-07 MED ORDER — NALBUPHINE HCL 10 MG/ML IJ SOLN: 5.0000 mg | Freq: Once | INTRAMUSCULAR | Status: DC | PRN

## 2016-11-07 MED ORDER — DIPHENHYDRAMINE HCL 50 MG/ML IJ SOLN: 12.5000 mg | INTRAMUSCULAR | Status: DC | PRN

## 2016-11-07 MED ORDER — NALOXONE HCL 0.4 MG/ML IJ SOLN: 0.4000 mg | INTRAMUSCULAR | Status: DC | PRN

## 2016-11-07 MED ORDER — NALOXONE HCL 2 MG/2ML IJ SOSY
1.0000 ug/kg/h | PREFILLED_SYRINGE | INTRAMUSCULAR | Status: DC | PRN
  Filled 2016-11-07: qty 2

## 2016-11-07 MED ORDER — DIPHENHYDRAMINE HCL 25 MG PO CAPS: 25.0000 mg | ORAL_CAPSULE | ORAL | Status: DC | PRN

## 2016-11-07 MED ORDER — NALBUPHINE HCL 10 MG/ML IJ SOLN: 5.0000 mg | INTRAMUSCULAR | Status: DC | PRN

## 2016-11-07 MED ORDER — SODIUM CHLORIDE 0.9% FLUSH: 3.0000 mL | INTRAVENOUS | Status: DC | PRN

## 2016-11-07 MED ORDER — KETOROLAC TROMETHAMINE 30 MG/ML IJ SOLN: 30.0000 mg | Freq: Four times a day (QID) | INTRAMUSCULAR | Status: AC | PRN

## 2016-11-07 MED ORDER — SCOPOLAMINE 1 MG/3DAYS TD PT72
1.0000 | MEDICATED_PATCH | Freq: Once | TRANSDERMAL | Status: DC
  Filled 2016-11-07: qty 1

## 2016-11-07 MED ORDER — ONDANSETRON HCL 4 MG/2ML IJ SOLN: 4.0000 mg | Freq: Three times a day (TID) | INTRAMUSCULAR | Status: DC | PRN

## 2016-11-07 NOTE — Progress Notes (Signed)
Subjective: Postpartum Day 1: Cesarean Delivery Patient reports ambulating, tolerating PO and + flatus. Foley still in place  Objective: Vital signs in last 24 hours: Temp:  [97.6 F (36.4 C)-99.1 F (37.3 C)] 99.1 F (37.3 C) (05/20 0700) Pulse Rate:  [75-125] 86 (05/20 0700) Resp:  [16-28] 16 (05/20 0700) BP: (84-146)/(54-91) 111/62 (05/20 0700) SpO2:  [74 %-100 %] 96 % (05/20 0700)  Physical Exam:  General: alert, cooperative, appears stated age, no distress and moderately obese Lochia: appropriate Uterine Fundus: firm Incision: small amount of bright red blood on dressing. Incision appears intact through honeycomb dressing DVT Evaluation: No evidence of DVT seen on physical exam.   Recent Labs  11/06/16 1022 11/07/16 0504  HGB 11.7* 9.8*  HCT 34.4* 27.9*    Assessment/Plan: Status post Cesarean section. Doing well postoperatively.  Continue current care.  Kayla [name] 11/07/2016, 9:15 AM

## 2016-11-07 NOTE — Lactation Note (Signed)
This note was copied from a baby's chart. Lactation Consultation Note; Lactation brochure given with basic teaching done. Mother reports that this is her first baby and she took breastfeeding classes. Mother reports that she is spoon feeding infant if she doesn't latch. Mother reports that her nipples are erect when she is attempting to breastfeed. Infant sleeping on mothers chest. LC offered to assist with feeding and mother declined reporting that she is not ready to wake infant. She reports that if she has difficulty latching infant she will call for assistance. Informed mother that infant may begin to cluster feed after 24 hours. Encouraged mother to continue to cue base feed and do frequent skin to skin.   Patient Name: Kayla [name] Reason for consult: Initial assessment   Maternal Data    Feeding Feeding Type: Breast Milk  LATCH Score/Interventions                      Lactation Tools Discussed/Used     Consult Status      Michel BickersKendrick, Shimon [place] McCoy [name], 3:28 PM

## 2016-11-07 NOTE — Lactation Note (Signed)
This note was copied from a baby's chart. Lactation Consultation Note  Patient Name: Kayla [name] OZHYQ'MToday's Date: 11/07/2016   Baby 28 hours old. Consulted with patient's bedside nurse, Rolly SalterHaley, RN about starting mom pumping in order to give baby EBM and get mom's milk flowing.   Maternal Data    Feeding    LATCH Score/Interventions                      Lactation Tools Discussed/Used     Consult Status      Sherlyn HayJennifer D Shahiem Bedwell 11/07/2016, 10:38 PM

## 2016-11-07 NOTE — Anesthesia Postprocedure Evaluation (Signed)
Anesthesia Post Note  Patient: Tawanna SatHope Boggio  Procedure(s) Performed: Procedure(s) (LRB): CESAREAN SECTION (N/A)  Patient location during evaluation: Mother Baby Anesthesia Type: Epidural Level of consciousness: awake and alert and oriented Pain management: pain level controlled Vital Signs Assessment: post-procedure vital signs reviewed and stable Respiratory status: spontaneous breathing and nonlabored ventilation Cardiovascular status: stable Postop Assessment: no headache, no backache, epidural receding, patient able to bend at knees, no signs of nausea or vomiting and adequate PO intake Anesthetic complications: no        Last Vitals:  Vitals:   11/07/16 0300 11/07/16 0700  BP:  111/62  Pulse:  86  Resp: 18 16  Temp: 36.8 C 37.3 C    Last Pain:  Vitals:   11/07/16 0700  TempSrc: Oral  PainSc: 3    Pain Goal: Patients Stated Pain Goal: 2 (11/07/16 0700)               Laban EmperorMalinova,Sheamus Hasting Hristova

## 2016-11-07 NOTE — Progress Notes (Signed)
Patient encouraged to ambulate halls this evening. Incentive spirometer was still in the bag so she was shown how to use it and did a teach back. Patient states her day nurse only came in once to see her today and that she [facility]. I apologized for the care and asked her if I can do anything about that for her she said no.

## 2016-11-08 MED ORDER — OXYCODONE HCL 5 MG PO TABS: 5.0000 mg | ORAL_TABLET | ORAL | 0 refills | Status: DC | PRN

## 2016-11-08 MED ORDER — IBUPROFEN 600 MG PO TABS: 600.0000 mg | ORAL_TABLET | Freq: Four times a day (QID) | ORAL | 0 refills | Status: DC

## 2016-11-08 NOTE — Discharge Summary (Signed)
OB Discharge Summary     Patient Name: Kayla [name] DOB: 12/15/1985 MRN: [id number]  Date of admission: 11/04/2016 Delivering MD: Lorne SkeensSCHENK, NICHOLAS MICHAEL   Date of discharge: 11/08/2016  Admitting diagnosis: INDUCTION Intrauterine pregnancy: 1961w1d     Secondary diagnosis:  Active Problems:   Gestational hypertension  Additional problems: Breech, PLTCS, GHTN     Discharge diagnosis: Term Pregnancy Delivered, Gestational Hypertension and Anemia                                                                                                Post partum procedures:none  Augmentation: Pitocin  Complications: None  Hospital course:  Induction of Labor With Cesarean Section  31 y.o. yo [id number] at 9761w1d was admitted to the hospital 11/04/2016 for induction of labor. Patient had a labor course significant for breech presentation and failed version. The patient went for cesarean section due to Malpresentation, and delivered a Viable infant,@BABYSUPPRESS (DBLINK,ept,110,,1,,) Membrane Rupture Time/Date: )5:53 PM ,11/06/2016   @Details  of operation can be found in separate operative Note.  Patient had an uncomplicated postpartum course. She is ambulating, tolerating a regular diet, passing flatus, and urinating well.  Patient is discharged home in stable condition on 11/08/16.                                    Physical exam  Vitals:   11/07/16 0700 11/07/16 1145 11/07/16 1755 11/08/16 0600  BP: 111/62 (!) 116/53 125/76 119/78  Pulse: 86 (!) 103 86 92  Resp: 16 18 18 18   Temp: 99.1 F (37.3 C) 99.3 F (37.4 C) 99.3 F (37.4 C) 97.6 F (36.4 C)  TempSrc: Oral Oral Oral Oral  SpO2: 96% 97%    Weight:      Height:       General: alert, cooperative and no distress Lochia: appropriate Uterine Fundus: firm Incision: Healing well with no significant drainage, No significant erythema, Dressing is clean, dry, and intact DVT Evaluation: No evidence of DVT seen on physical exam. No cords or  calf tenderness. No significant calf/ankle edema. Labs: Lab Results  Component Value Date   WBC 10.8 (H) 11/07/2016   HGB 9.8 (L) 11/07/2016   HCT 27.9 (L) 11/07/2016   MCV 76.2 (L) 11/07/2016   PLT 218 11/07/2016   CMP Latest Ref Rng & Units 11/04/2016  Glucose 65 - 99 mg/dL 82  BUN 6 - 20 mg/dL <8(I<5(L)  Creatinine 6.960.44 - 1.00 mg/dL 2.950.49  Sodium 284135 - 132145 mmol/L 137  Potassium 3.5 - 5.1 mmol/L 3.8  Chloride 101 - 111 mmol/L 107  CO2 22 - 32 mmol/L 21(L)  Calcium 8.9 - 10.3 mg/dL 9.0  Total Protein 6.5 - 8.1 g/dL 6.1(L)  Total Bilirubin 0.3 - 1.2 mg/dL 4.4(W0.1(L)  Alkaline Phos 38 - 126 U/L 129(H)  AST 15 - 41 U/L 23  ALT 14 - 54 U/L 12(L)    Discharge instruction: per After Visit Summary and "Baby and Me Booklet".  After visit meds:  Allergies as of 11/08/2016  No Known Allergies     Medication List    TAKE these medications   ibuprofen 600 MG tablet Commonly known as:  ADVIL,MOTRIN Take 1 tablet (600 mg total) by mouth every 6 (six) hours.   oxyCODONE 5 MG immediate release tablet Commonly known as:  Oxy IR/ROXICODONE Take 1 tablet (5 mg total) by mouth every 4 (four) hours as needed (pain scale 4-7).   PRENATE PIXIE 10-0.6-0.4-200 MG Caps Take 1 tablet by mouth daily.   valACYclovir 1000 MG tablet Commonly known as:  VALTREX Take 1 tablet (1,000 mg total) by mouth 2 (two) times daily.       Diet: routine diet  Activity: Advance as tolerated. Pelvic rest for 6 weeks.   Outpatient follow up:4 weeks Follow up Appt:Future Appointments Date Time Provider Department Center  12/02/2016 10:30 AM Orvilla Cornwall A, CNM [facility] None   Follow up Visit:No Follow-up on file.  Postpartum contraception: Progesterone only pills  Newborn Data: Live born female  Birth Weight: 6 lb 13.5 oz (3105 g) APGAR: 8, 9  Baby Feeding: Breast Disposition:home with mother   11/08/2016 [name], CNM

## 2016-11-08 NOTE — Lactation Note (Signed)
This note was copied from a baby's chart. Lactation Consultation Note  Patient Name: Kayla [name] GMWNU'UToday's Date: 11/08/2016 Reason for consult: Follow-up assessment Mom reports she is pump/bottle feeding for now. Mom reports she will work on latching baby when she gets home. Declined LC assist at this visit. Mom has supplemented 3 times during the evening/night - 2 times 10 ml, 1 time 4 ml. Supplemental guidelines reviewed with Mom per hours of age, hand out given. Advised if baby not going to breast, baby should have 15-20 ml each feeding, baby now 40 hours old increasing per guidelines given. Mom reports she has DEBP at home. Advised with pump/bottle feeding to pump every 3 hours for 15 minutes to encourage milk production, prevent engorgement and protect milk supply. Engorgement care reviewed if needed. Breast milk storage guidelines discussed, refer to PP booklet, page 36-37. Offered OP f/u, Mom declined. Advised of support group.   Maternal Data    Feeding    LATCH Score/Interventions                      Lactation Tools Discussed/Used Tools: Pump;Nipple Shields Nipple shield size: 24 Breast pump type: Double-Electric Breast Pump   Consult Status Consult Status: Complete    Alfred LevinsGranger, Kayla [name] 11/08/2016, 9:58 AM

## 2016-11-08 NOTE — Discharge Summary (Signed)
OB Discharge Summary     Patient Name: Kayla [name] DOB: 1985/12/30 MRN: [id number]  Date of admission: 11/04/2016 Delivering MD: Lorne Skeens   Date of discharge: 11/08/2016  Admitting diagnosis: INDUCTION Intrauterine pregnancy: [redacted]w[redacted]d     Secondary diagnosis:  Active Problems:   Gestational hypertension  Additional problems: none     Discharge diagnosis: Term Pregnancy Delivered                                                                                                Post partum procedures:none  Augmentation: Pitocin  Complications: None  Hospital course:  Induction of Labor With Cesarean Section  31 y.o. yo [id number] at [redacted]w[redacted]d was admitted to the hospital 11/04/2016 for induction of labor. Patient had a labor course significant for breech presentation found during induction of labor. The patient went for cesarean section due to Malpresentation, and delivered a Viable infant,@BABYSUPPRESS (DBLINK,ept,110,,1,,) Membrane Rupture Time/Date: )5:53 PM ,11/06/2016   @Details  of operation can be found in separate operative Note.  Patient had an uncomplicated postpartum course. She is ambulating, tolerating a regular diet, passing flatus, and urinating well.  Patient is discharged home in stable condition on 11/08/16.                                    Physical exam  Vitals:   11/07/16 0700 11/07/16 1145 11/07/16 1755 11/08/16 0600  BP: 111/62 (!) 116/53 125/76 119/78  Pulse: 86 (!) 103 86 92  Resp: 16 18 18 18   Temp: 99.1 F (37.3 C) 99.3 F (37.4 C) 99.3 F (37.4 C) 97.6 F (36.4 C)  TempSrc: Oral Oral Oral Oral  SpO2: 96% 97%    Weight:      Height:       General: alert, cooperative and no distress Lochia: appropriate Uterine Fundus: firm Incision: Healing well with no significant drainage DVT Evaluation: No evidence of DVT seen on physical exam. Labs: Lab Results  Component Value Date   WBC 10.8 (H) 11/07/2016   HGB 9.8 (L) 11/07/2016   HCT 27.9 (L)  11/07/2016   MCV 76.2 (L) 11/07/2016   PLT 218 11/07/2016   CMP Latest Ref Rng & Units 11/04/2016  Glucose 65 - 99 mg/dL 82  BUN 6 - 20 mg/dL <1(Y)  Creatinine 7.82 - 1.00 mg/dL 9.56  Sodium 213 - 086 mmol/L 137  Potassium 3.5 - 5.1 mmol/L 3.8  Chloride 101 - 111 mmol/L 107  CO2 22 - 32 mmol/L 21(L)  Calcium 8.9 - 10.3 mg/dL 9.0  Total Protein 6.5 - 8.1 g/dL 6.1(L)  Total Bilirubin 0.3 - 1.2 mg/dL 5.7(Q)  Alkaline Phos 38 - 126 U/L 129(H)  AST 15 - 41 U/L 23  ALT 14 - 54 U/L 12(L)    Discharge instruction: per After Visit Summary and "Baby and Me Booklet".  After visit meds:  Allergies as of 11/08/2016   No Known Allergies     Medication List    TAKE these medications   ibuprofen 600 MG tablet Commonly  known as:  ADVIL,MOTRIN Take 1 tablet (600 mg total) by mouth every 6 (six) hours.   oxyCODONE 5 MG immediate release tablet Commonly known as:  Oxy IR/ROXICODONE Take 1 tablet (5 mg total) by mouth every 4 (four) hours as needed (pain scale 4-7).   PRENATE PIXIE 10-0.6-0.4-200 MG Caps Take 1 tablet by mouth daily.   valACYclovir 1000 MG tablet Commonly known as:  VALTREX Take 1 tablet (1,000 mg total) by mouth 2 (two) times daily.       Diet: routine diet  Activity: Advance as tolerated. Pelvic rest for 6 weeks.   Outpatient follow up:6 weeks Follow up Appt:Future Appointments Date Time Provider Department Center  12/02/2016 10:30 AM Orvilla Cornwallenney, Rachelle A, CNM [facility] None   Follow up Visit:No Follow-up on file.  Postpartum contraception: Minipill  Newborn Data: Live born female  Birth Weight: 6 lb 13.5 oz (3105 g) APGAR: 8, 9  Baby Feeding: Breast Disposition:home with mother   11/08/2016 Wynelle BourgeoisMarie Petrona Wyeth, CNM

## 2016-11-08 NOTE — Progress Notes (Signed)
CSW received consult for "hx of Anxiety-on meds."  CSW reviewed MOB's medical record and does not see hx of Anxiety documented anywhere in the chart.  CSW also does not see notes of any hx of medication for Anxiety.  CSW is screening out referral at this time.  Please call CSW if concerns arise or by MOB's request.

## 2016-11-08 NOTE — Discharge Instructions (Signed)

## 2016-11-08 NOTE — Progress Notes (Signed)
Subjective: Postpartum Day #2: Cesarean Delivery Patient reports incisional pain, tolerating PO, + flatus and no problems voiding.    Objective: Vital signs in last 24 hours: Temp:  [97.6 F (36.4 C)-99.3 F (37.4 C)] 97.6 F (36.4 C) (05/21 0600) Pulse Rate:  [86-103] 92 (05/21 0600) Resp:  [18] 18 (05/21 0600) BP: (116-125)/(53-78) 119/78 (05/21 0600) SpO2:  [97 %] 97 % (05/20 1145)  Physical Exam:  General: alert, cooperative and no distress Lochia: appropriate Uterine Fundus: firm Incision: no significant drainage, no dehiscence, no significant erythema DVT Evaluation: No evidence of DVT seen on physical exam. No cords or calf tenderness. No significant calf/ankle edema.   Recent Labs  11/06/16 1022 11/07/16 0504  HGB 11.7* 9.8*  HCT 34.4* 27.9*    Assessment/Plan: Status post Cesarean section. Doing well postoperatively.  Discharge home with standard precautions and return to clinic in 4-6 weeks.  Roe Coombsachelle A Harman Ferrin, CNM 11/08/2016, 7:23 AM

## 2016-11-11 ENCOUNTER — Encounter: Payer: Managed Care, Other (non HMO) | Admitting: Certified Nurse Midwife

## 2016-11-18 ENCOUNTER — Encounter: Payer: Managed Care, Other (non HMO) | Admitting: Certified Nurse Midwife

## 2016-12-02 ENCOUNTER — Ambulatory Visit (INDEPENDENT_AMBULATORY_CARE_PROVIDER_SITE_OTHER): Payer: Managed Care, Other (non HMO) | Admitting: Certified Nurse Midwife

## 2016-12-02 ENCOUNTER — Encounter: Payer: Self-pay | Admitting: Certified Nurse Midwife

## 2016-12-02 DIAGNOSIS — O34219 Maternal care for unspecified type scar from previous cesarean delivery: Secondary | ICD-10-CM | POA: Insufficient documentation

## 2016-12-02 DIAGNOSIS — Z98891 History of uterine scar from previous surgery: Secondary | ICD-10-CM

## 2016-12-02 DIAGNOSIS — O924 Hypogalactia: Secondary | ICD-10-CM

## 2016-12-02 NOTE — Progress Notes (Signed)
Patient wants BC pills.  Subjective:     Kayla [name] is a 31 y.o. female who presents for a postpartum visit. She is 3 weeks postpartum following a low cervical transverse Cesarean section. I have fully reviewed the prenatal and intrapartum course. The delivery was at 40 gestational weeks. Outcome: primary cesarean section, low transverse incision. Anesthesia: epidural. Postpartum course has been UNREMARKABLE. Baby's course has been UNREMARKABLE. Baby is feeding by breast. Bleeding staining only. Bowel function is abnormal: constipated. Bladder function is normal. Patient is sexually active. Contraception method is condoms. Postpartum depression screening: negative.  Reports lack of sleep and difficulties breast feeding.  States that she is pumping about every 5 hours only producing about 1 oz at at time.  FOB went back to work.  Encouraged her to drink more water and eat food, sleep when she can.  Declines Lactation consult today.  [place] encouraged as well to increase milk production along with nipple shield that she has from the hospital. Is currently sexually active.  Discussed blood pressure; was normal yesterday.  Not on blood pressure medication.  Desires to be on OCPs if not breast feeding, discussed needing to wait until 6 weeks PP and the reasons why: increased risk of DVTs, etc.   The following portions of the patient's history were reviewed and updated as appropriate: allergies, current medications, past family history, past medical history, past social history, past surgical history and problem list.  Review of Systems Pertinent items noted in HPI and remainder of comprehensive ROS otherwise negative.   Objective:    There were no vitals taken for this visit.  General:  alert, cooperative, no distress and moderately obese   Breasts:  inspection negative, no nipple discharge or bleeding, no masses or nodularity palpable  Lungs: clear to auscultation bilaterally  Heart:  regular rate  and rhythm, S1, S2 normal, no murmur, click, rub or gallop  Abdomen: soft, non-tender; bowel sounds normal; no masses,  no organomegaly, obtunded.  C-section wound healed, no s/s infection, wound edges closed, C/D/I.    Vulva:  normal  Pelvic Exam: Not performed. and still bleeding from delivery        Assessment:     Normal 3 week postpartum exam. Pap smear not done at today's visit.    Lactation support given   Contraception counseling    S/P PLTCS  Plan:    1. Contraception: abstinence and condoms 2.  Pap smear in 3 weeks with pills for contraception.  Last pap in 2016 was normal.  3. Follow up in: 3 weeks or as needed.

## 2016-12-27 ENCOUNTER — Ambulatory Visit: Payer: Managed Care, Other (non HMO) | Admitting: Certified Nurse Midwife

## 2016-12-27 ENCOUNTER — Encounter: Payer: Self-pay | Admitting: Obstetrics

## 2016-12-30 ENCOUNTER — Encounter: Payer: Self-pay | Admitting: Certified Nurse Midwife

## 2016-12-30 ENCOUNTER — Ambulatory Visit: Payer: Managed Care, Other (non HMO) | Admitting: Obstetrics & Gynecology

## 2016-12-30 NOTE — Addendum Note (Signed)
Addendum  created 12/30/16 1441 by Colleen Kotlarz, MD   Sign clinical note    

## 2016-12-30 NOTE — Progress Notes (Unsigned)
Letter done to extend Maternity Leave.

## 2016-12-30 NOTE — Anesthesia Postprocedure Evaluation (Signed)
Anesthesia Post Note  Patient: Kayla [name]  Procedure(s) Performed: Procedure(s) (LRB): CESAREAN SECTION (N/A)     Anesthesia Post Evaluation  Last Vitals:  Vitals:   11/07/16 1755 11/08/16 0600  BP: 125/76 119/78  Pulse: 86 92  Resp: 18 18  Temp: 37.4 C 36.4 C    Last Pain:  Vitals:   11/08/16 1000  TempSrc:   PainSc: 0-No pain                 Phillips Groutarignan, Ronnisha Felber

## 2017-01-03 ENCOUNTER — Ambulatory Visit (INDEPENDENT_AMBULATORY_CARE_PROVIDER_SITE_OTHER): Payer: Managed Care, Other (non HMO) | Admitting: Obstetrics

## 2017-01-03 ENCOUNTER — Encounter: Payer: Self-pay | Admitting: Obstetrics

## 2017-01-03 ENCOUNTER — Other Ambulatory Visit (HOSPITAL_COMMUNITY)
Admission: RE | Admit: 2017-01-03 | Discharge: 2017-01-03 | Disposition: A | Discharge status: Home / Self Care | Payer: Managed Care, Other (non HMO) | Source: Ambulatory Visit | Attending: Certified Nurse Midwife | Admitting: Certified Nurse Midwife

## 2017-01-03 VITALS — BP 130/90 | HR 81 | Ht 63.0 in | Wt 220.0 lb

## 2017-01-03 DIAGNOSIS — Z01419 Encounter for gynecological examination (general) (routine) without abnormal findings: Secondary | ICD-10-CM | POA: Insufficient documentation

## 2017-01-03 DIAGNOSIS — Z113 Encounter for screening for infections with a predominantly sexual mode of transmission: Secondary | ICD-10-CM

## 2017-01-03 DIAGNOSIS — N898 Other specified noninflammatory disorders of vagina: Secondary | ICD-10-CM

## 2017-01-03 DIAGNOSIS — F53 Postpartum depression: Secondary | ICD-10-CM

## 2017-01-03 DIAGNOSIS — Z3041 Encounter for surveillance of contraceptive pills: Secondary | ICD-10-CM

## 2017-01-03 DIAGNOSIS — Z124 Encounter for screening for malignant neoplasm of cervix: Secondary | ICD-10-CM

## 2017-01-03 DIAGNOSIS — O99345 Other mental disorders complicating the puerperium: Secondary | ICD-10-CM

## 2017-01-03 NOTE — Progress Notes (Signed)
Subjective:        Kayla [name] is a 31 y.o. female here for a routine exam.  Current complaints: Right wrist pain.    Personal health questionnaire:  Is patient Ashkenazi Jewish, have a family history of breast and/or ovarian cancer: no Is there a family history of uterine cancer diagnosed at age < 74, gastrointestinal cancer, urinary tract cancer, family member who is a Personnel officer syndrome-associated carrier: no Is the patient overweight and hypertensive, family history of diabetes, personal history of gestational diabetes, preeclampsia or PCOS: no Is patient over 2, have PCOS,  family history of premature CHD under age 80, diabetes, smoke, have hypertension or peripheral artery disease:  no At any time, has a partner hit, kicked or otherwise hurt or frightened you?: no Over the past 2 weeks, have you felt down, depressed or hopeless?: no Over the past 2 weeks, have you felt little interest or pleasure in doing things?:no   Gynecologic History Patient's last menstrual period was 12/24/2016 (exact date). Contraception: condoms and OCP (estrogen/progesterone) Last Pap: 2016. Results were: normal Last mammogram: n/a. Results were: n/a  Obstetric History OB History  Gravida Para Term Preterm AB Living  3 1 1   2 1   SAB TAB Ectopic Multiple Live Births    2   0 1    # Outcome Date GA Lbr Len/2nd Weight Sex Delivery Anes PTL Lv  3 Term 11/06/16 [redacted]w[redacted]d  6 lb 13.5 oz (3.105 kg) F CS-LTranv EPI  LIV  2 TAB 2012          1 TAB 2011              Past Medical History:  Diagnosis Date  . HSV (herpes simplex virus) infection   . Medical history non-contributory     Past Surgical History:  Procedure Laterality Date  . CESAREAN SECTION N/A 11/06/2016   Procedure: CESAREAN SECTION;  Surgeon: Hermina Staggers, MD;  Location: [facility] BIRTHING SUITES;  Service: Obstetrics;  Laterality: N/A;     Current Outpatient Prescriptions:  .  ibuprofen (ADVIL,MOTRIN) 600 MG tablet, Take 1 tablet (600 mg  total) by mouth every 6 (six) hours., Disp: 30 tablet, Rfl: 0 .  oxyCODONE (OXY IR/ROXICODONE) 5 MG immediate release tablet, Take 1 tablet (5 mg total) by mouth every 4 (four) hours as needed (pain scale 4-7)., Disp: 30 tablet, Rfl: 0 .  Prenat-FeAsp-Meth-FA-DHA w/o A (PRENATE PIXIE) 10-0.6-0.4-200 MG CAPS, Take 1 tablet by mouth daily., Disp: 30 capsule, Rfl: 12 .  valACYclovir (VALTREX) 1000 MG tablet, Take 1 tablet (1,000 mg total) by mouth 2 (two) times daily., Disp: 20 tablet, Rfl: 0 No Known Allergies  Social History  Substance Use Topics  . Smoking status: Former Games developer  . Smokeless tobacco: Former Neurosurgeon  . Alcohol use No    Family History  Problem Relation Age of Onset  . Heart disease Father   . Hypertension Father   . Cancer Maternal Grandmother        leukemia  . Diabetes Maternal Grandmother   . Hyperlipidemia Maternal Grandfather   . Heart disease Paternal Grandmother       Review of Systems  Constitutional: negative for fatigue and weight loss Respiratory: negative for cough and wheezing Cardiovascular: negative for chest pain, fatigue and palpitations Gastrointestinal: negative for abdominal pain and change in bowel habits Musculoskeletal:negative for myalgias Neurological: negative for gait problems and tremors Behavioral/Psych: positive for depression Endocrine: negative for temperature intolerance    Genitourinary:negative for  abnormal menstrual periods, genital lesions, hot flashes, sexual problems and vaginal discharge Integument/breast: negative for breast lump, breast tenderness, nipple discharge and skin lesion(s)    Objective:       BP 130/90   Pulse 81   Ht 5\' 3"  (1.6 m)   Wt 220 lb (99.8 kg)   LMP 12/24/2016 (Exact Date)   Breastfeeding? No   BMI 38.97 kg/m  General:   alert  Skin:   no rash or abnormalities  Lungs:   clear to auscultation bilaterally  Heart:   regular rate and rhythm, S1, S2 normal, no murmur, click, rub or gallop   Breasts:   normal without suspicious masses, skin or nipple changes or axillary nodes  Abdomen:  normal findings: no organomegaly, soft, non-tender and no hernia  Pelvis:  External genitalia: normal general appearance Urinary system: urethral meatus normal and bladder without fullness, nontender Vaginal: normal without tenderness, induration or masses Cervix: normal appearance Adnexa: normal bimanual exam Uterus: anteverted and non-tender, normal size   Lab Review Urine pregnancy test Labs reviewed yes Radiologic studies reviewed no  50% of 20 min visit spent on counseling and coordination of care.    Assessment:    Healthy female exam.    Plan:   1. Encounter for routine gynecological examination with Papanicolaou smear of cervix Rx: - Cytology - PAP  2. Depression, postpartum Rx: - Ambulatory referral to [facility]  3. Vaginal discharge Rx: - Cervicovaginal ancillary only  4. Encounter for surveillance of contraceptive pills - starting Lo Loestrin 24 Fe with next cycle    Education reviewed: calcium supplements, depression evaluation, low fat, low cholesterol diet, self breast exams and weight bearing exercise. Contraception: OCP (estrogen/progesterone). Follow up in: 1 year.   No orders of the defined types were placed in this encounter.  Orders Placed This Encounter  Procedures  . Ambulatory referral to Integrated Behavioral Health    Referral Priority:   Routine    Referral Type:   Consultation    Referral Reason:   Specialty Services Required    Number of Visits Requested:   1

## 2017-01-03 NOTE — Patient Instructions (Addendum)
Postpartum Depression and Baby Blues The postpartum period begins right after the birth of a baby. During this time, there is often a great amount of joy and excitement. It is also a time of many changes in the life of the parents. Regardless of how many times a mother gives birth, each child brings new challenges and dynamics to the family. It is not unusual to have feelings of excitement along with confusing shifts in moods, emotions, and thoughts. All mothers are at risk of developing postpartum depression or the "baby blues." These mood changes can occur right after giving birth, or they may occur many months after giving birth. The baby blues or postpartum depression can be mild or severe. Additionally, postpartum depression can go away rather quickly, or it can be a long-term condition. What are the causes? Raised hormone levels and the rapid drop in those levels are thought to be a main cause of postpartum depression and the baby blues. A number of hormones change during and after pregnancy. Estrogen and progesterone usually decrease right after the delivery of your baby. The levels of thyroid hormone and various cortisol steroids also rapidly drop. Other factors that play a role in these mood changes include major life events and genetics. What increases the risk? If you have any of the following risks for the baby blues or postpartum depression, know what symptoms to watch out for during the postpartum period. Risk factors that may increase the likelihood of getting the baby blues or postpartum depression include:  Having a personal or family history of depression.  Having depression while being pregnant.  Having premenstrual mood issues or mood issues related to oral contraceptives.  Having a lot of life stress.  Having marital conflict.  Lacking a social support network.  Having a baby with special needs.  Having health problems, such as diabetes.  What are the signs or  symptoms? Symptoms of baby blues include:  Brief changes in mood, such as going from extreme happiness to sadness.  Decreased concentration.  Difficulty sleeping.  Crying spells, tearfulness.  Irritability.  Anxiety.  Symptoms of postpartum depression typically begin within the first month after giving birth. These symptoms include:  Difficulty sleeping or excessive sleepiness.  Marked weight loss.  Agitation.  Feelings of worthlessness.  Lack of interest in activity or food.  Postpartum psychosis is a very serious condition and can be dangerous. Fortunately, it is rare. Displaying any of the following symptoms is cause for immediate medical attention. Symptoms of postpartum psychosis include:  Hallucinations and delusions.  Bizarre or disorganized behavior.  Confusion or disorientation.  How is this diagnosed? A diagnosis is made by an evaluation of your symptoms. There are no medical or lab tests that lead to a diagnosis, but there are various questionnaires that a health care provider may use to identify those with the baby blues, postpartum depression, or psychosis. Often, a screening tool called the Edinburgh Postnatal Depression Scale is used to diagnose depression in the postpartum period. How is this treated? The baby blues usually goes away on its own in 1-2 weeks. Social support is often all that is needed. You will be encouraged to get adequate sleep and rest. Occasionally, you may be given medicines to help you sleep. Postpartum depression requires treatment because it can last several months or longer if it is not treated. Treatment may include individual or group therapy, medicine, or both to address any social, physiological, and psychological factors that may play a role in the   depression. Regular exercise, a healthy diet, rest, and social support may also be strongly recommended. Postpartum psychosis is more serious and needs treatment right away.  Hospitalization is often needed. Follow these instructions at home:  Get as much rest as you can. Nap when the baby sleeps.  Exercise regularly. Some women find yoga and walking to be beneficial.  Eat a balanced and nourishing diet.  Do little things that you enjoy. Have a cup of tea, take a bubble bath, read your favorite magazine, or listen to your favorite music.  Avoid alcohol.  Ask for help with household chores, cooking, grocery shopping, or running errands as needed. Do not try to do everything.  Talk to people close to you about how you are feeling. Get support from your partner, family members, friends, or other new moms.  Try to stay positive in how you think. Think about the things you are grateful for.  Do not spend a lot of time alone.  Only take over-the-counter or prescription medicine as directed by your health care provider.  Keep all your postpartum appointments.  Let your health care provider know if you have any concerns. Contact a health care provider if: You are having a reaction to or problems with your medicine. Get help right away if:  You have suicidal feelings.  You think you may harm the baby or someone else. This information is not intended to replace advice given to you by your health care provider. Make sure you discuss any questions you have with your health care provider. Document Released: 03/11/2004 Document Revised: 11/13/2015 Document Reviewed: 03/19/2013 Elsevier Interactive Patient Education  2017 Elsevier Inc.  

## 2017-01-03 NOTE — Progress Notes (Signed)
Patient presents for Annual Exam with PAP and Cultures.

## 2017-01-04 DIAGNOSIS — Z3493 Encounter for supervision of normal pregnancy, unspecified, third trimester: Secondary | ICD-10-CM

## 2017-01-04 LAB — CYTOLOGY - PAP: Diagnosis: NEGATIVE

## 2017-01-04 LAB — CERVICOVAGINAL ANCILLARY ONLY
BACTERIAL VAGINITIS: NEGATIVE
CANDIDA VAGINITIS: NEGATIVE
CHLAMYDIA, DNA PROBE: NEGATIVE
Neisseria Gonorrhea: NEGATIVE
Trichomonas: NEGATIVE

## 2017-01-06 ENCOUNTER — Encounter: Payer: Self-pay | Admitting: Certified Nurse Midwife

## 2017-01-12 ENCOUNTER — Ambulatory Visit: Payer: Managed Care, Other (non HMO) | Admitting: Certified Nurse Midwife

## 2017-01-12 ENCOUNTER — Encounter: Payer: Self-pay | Admitting: Certified Nurse Midwife

## 2017-01-17 ENCOUNTER — Telehealth: Payer: Self-pay

## 2017-01-17 NOTE — Telephone Encounter (Signed)
Pt sent message asking for nurse to call her. Called patient, no answer. Left message for patient to return call

## 2017-01-18 ENCOUNTER — Other Ambulatory Visit: Payer: Self-pay | Admitting: Obstetrics

## 2017-01-18 ENCOUNTER — Encounter: Payer: Self-pay | Admitting: Obstetrics

## 2017-01-18 ENCOUNTER — Other Ambulatory Visit: Payer: Self-pay | Admitting: Certified Nurse Midwife

## 2017-01-19 ENCOUNTER — Ambulatory Visit
Admission: RE | Admit: 2017-01-19 | Discharge: 2017-01-19 | Disposition: A | Discharge status: Home / Self Care | Payer: Medicaid Other | Source: Ambulatory Visit | Attending: Family | Admitting: Family

## 2017-01-19 ENCOUNTER — Telehealth: Payer: Self-pay

## 2017-01-19 ENCOUNTER — Other Ambulatory Visit: Payer: Self-pay | Admitting: Family

## 2017-01-19 DIAGNOSIS — M25531 Pain in right wrist: Secondary | ICD-10-CM

## 2017-01-19 NOTE — Telephone Encounter (Signed)
Dr. Clearance CootsHarper will need to added his note to reflect the FMLA paperwork.  Thank you.

## 2017-01-19 NOTE — Telephone Encounter (Signed)
Returned call to [employer] again to speak with Kendal HymenBonnie who is handling patient claim. Spoke with Kendal HymenBonnie, and she is needing documentation stating symptoms other than lack of sleep, and needs to show that she is not able to work. Are the symptoms during work hours, and outrside of work hours. Deadline for this is 8/2. Kendal HymenBonnie states that she will extend this through the beginning of next week 8/7. Kendal HymenBonnie states that she does not need any other paperwork other than what was sent on 7/25 requesting the additional information.

## 2017-01-19 NOTE — Telephone Encounter (Signed)
[facility] to [facility]

## 2017-02-01 ENCOUNTER — Encounter: Payer: Self-pay | Admitting: Obstetrics

## 2017-02-15 ENCOUNTER — Encounter: Payer: Self-pay | Admitting: Obstetrics

## 2017-02-18 ENCOUNTER — Encounter: Payer: Self-pay | Admitting: Obstetrics

## 2017-02-19 ENCOUNTER — Encounter: Payer: Self-pay | Admitting: Obstetrics

## 2017-02-22 ENCOUNTER — Other Ambulatory Visit: Payer: Self-pay

## 2017-02-22 ENCOUNTER — Telehealth: Payer: Self-pay

## 2017-02-22 DIAGNOSIS — Z30011 Encounter for initial prescription of contraceptive pills: Secondary | ICD-10-CM

## 2017-02-22 MED ORDER — NORETHIN-ETH ESTRAD-FE BIPHAS 1 MG-10 MCG / 10 MCG PO TABS: 1.0000 | ORAL_TABLET | Freq: Every day | ORAL | 11 refills | Status: DC

## 2017-02-22 NOTE — Telephone Encounter (Signed)
Pt came in to the office stating that she needed a return to work note. Work note provided. She also states that she needs a prescription for the Lo Loestrin. She was provided samples during power outage. Pt states that she would like to remain on these. Rx sent to pharmacy, and condoms provided

## 2018-07-19 ENCOUNTER — Inpatient Hospital Stay (HOSPITAL_COMMUNITY)
Admission: AD | Admit: 2018-07-19 | Discharge: 2018-07-19 | Disposition: A | Discharge status: Home / Self Care | Payer: Self-pay | Attending: Obstetrics and Gynecology | Admitting: Obstetrics and Gynecology

## 2018-07-19 ENCOUNTER — Inpatient Hospital Stay (HOSPITAL_COMMUNITY): Payer: Self-pay

## 2018-07-19 ENCOUNTER — Encounter (HOSPITAL_COMMUNITY): Payer: Self-pay | Admitting: *Deleted

## 2018-07-19 DIAGNOSIS — O3680X0 Pregnancy with inconclusive fetal viability, not applicable or unspecified: Secondary | ICD-10-CM

## 2018-07-19 DIAGNOSIS — Z3A01 Less than 8 weeks gestation of pregnancy: Secondary | ICD-10-CM

## 2018-07-19 DIAGNOSIS — O2340 Unspecified infection of urinary tract in pregnancy, unspecified trimester: Secondary | ICD-10-CM

## 2018-07-19 DIAGNOSIS — R109 Unspecified abdominal pain: Secondary | ICD-10-CM | POA: Diagnosis present

## 2018-07-19 DIAGNOSIS — O2341 Unspecified infection of urinary tract in pregnancy, first trimester: Secondary | ICD-10-CM

## 2018-07-19 DIAGNOSIS — Z3490 Encounter for supervision of normal pregnancy, unspecified, unspecified trimester: Secondary | ICD-10-CM

## 2018-07-19 DIAGNOSIS — R1030 Lower abdominal pain, unspecified: Secondary | ICD-10-CM | POA: Insufficient documentation

## 2018-07-19 DIAGNOSIS — O26891 Other specified pregnancy related conditions, first trimester: Secondary | ICD-10-CM

## 2018-07-19 DIAGNOSIS — Z5321 Procedure and treatment not carried out due to patient leaving prior to being seen by health care provider: Secondary | ICD-10-CM | POA: Insufficient documentation

## 2018-07-19 LAB — CBC WITH DIFFERENTIAL/PLATELET
Basophils Absolute: 0 10*3/uL (ref 0.0–0.1)
Basophils Relative: 0 %
Eosinophils Absolute: 0.1 10*3/uL (ref 0.0–0.5)
Eosinophils Relative: 1 %
HCT: 36.4 % (ref 36.0–46.0)
HEMOGLOBIN: 12.5 g/dL (ref 12.0–15.0)
Lymphocytes Relative: 39 %
Lymphs Abs: 2.9 10*3/uL (ref 0.7–4.0)
MCH: 27.7 pg (ref 26.0–34.0)
MCHC: 34.3 g/dL (ref 30.0–36.0)
MCV: 80.5 fL (ref 80.0–100.0)
Monocytes Absolute: 0.3 10*3/uL (ref 0.1–1.0)
Monocytes Relative: 4 %
Neutro Abs: 4.2 10*3/uL (ref 1.7–7.7)
Neutrophils Relative %: 56 %
Platelets: 280 10*3/uL (ref 150–400)
RBC: 4.52 MIL/uL (ref 3.87–5.11)
RDW: 14.2 % (ref 11.5–15.5)
WBC: 7.4 10*3/uL (ref 4.0–10.5)
nRBC: 0 % (ref 0.0–0.2)

## 2018-07-19 LAB — WET PREP, GENITAL
Sperm: NONE SEEN
Trich, Wet Prep: NONE SEEN
Yeast Wet Prep HPF POC: NONE SEEN

## 2018-07-19 LAB — HCG, QUANTITATIVE, PREGNANCY: HCG, BETA CHAIN, QUANT, S: 6456 m[IU]/mL — AB (ref <5)

## 2018-07-19 LAB — URINALYSIS, ROUTINE W REFLEX MICROSCOPIC
Bilirubin Urine: NEGATIVE
Glucose, UA: NEGATIVE mg/dL
Hgb urine dipstick: NEGATIVE
Ketones, ur: 5 mg/dL — AB
Leukocytes, UA: NEGATIVE
Nitrite: POSITIVE — AB
PH: 6 (ref 5.0–8.0)
Protein, ur: NEGATIVE mg/dL
Specific Gravity, Urine: 1.026 (ref 1.005–1.030)

## 2018-07-19 LAB — POCT PREGNANCY, URINE: Preg Test, Ur: POSITIVE — AB

## 2018-07-19 MED ORDER — CEPHALEXIN 500 MG PO CAPS: 500.0000 mg | ORAL_CAPSULE | Freq: Four times a day (QID) | ORAL | 0 refills | Status: DC

## 2018-07-19 MED ORDER — PRENATE PIXIE 10-0.6-0.4-200 MG PO CAPS: 1.0000 | ORAL_CAPSULE | Freq: Every day | ORAL | 12 refills | Status: DC

## 2018-07-19 NOTE — Discharge Instructions (Signed)
Ovarian Cyst An ovarian cyst is a fluid-filled sac on an ovary. The ovaries are organs that make eggs in women. Most ovarian cysts go away on their own and are not cancerous (are benign). Some cysts need treatment. Follow these instructions at home:  Take over-the-counter and prescription medicines only as told by your doctor.  Do not drive or use heavy machinery while taking prescription pain medicine.  Get pelvic exams and Pap tests as often as told by your doctor.  Return to your normal activities as told by your doctor. Ask your doctor what activities are safe for you.  Do not use any products that contain nicotine or tobacco, such as cigarettes and e-cigarettes. If you need help quitting, ask your doctor.  Keep all follow-up visits as told by your doctor. This is important. Contact a doctor if:  Your periods are: ? Late. ? Irregular. ? Painful.   Your periods stop.  You have pelvic pain that does not go away.  You have pressure on your bladder.  You have trouble making your bladder empty when you pee (urinate).  You have pain during sex.  You have any of the following in your belly (abdomen): ? A feeling of fullness. ? Pressure. ? Discomfort. ? Pain that does not go away. ? Swelling.  You feel sick most of the time.  You have trouble pooping (have constipation).  You are not as hungry as usual (you lose your appetite).  You get very bad acne.  You start to have more hair on your body and face.  You are gaining weight or losing weight without changing your exercise and eating habits.  You think you may be pregnant. Get help right away if:  You have belly pain that is very bad or gets worse.  You cannot eat or drink without throwing up (vomiting).  You suddenly get a fever.  Your period is a lot heavier than usual. This information is not intended to replace advice given to you by your health care provider. Make sure you discuss any questions you have  with your health care provider. Document Released: 11/24/2007 Document Revised: 12/26/2015 Document Reviewed: 11/09/2015 Elsevier Interactive Patient Education  2019 Elsevier Inc. Abdominal Pain During Pregnancy  Abdominal pain is common during pregnancy, and has many possible causes. Some causes are more serious than others, and sometimes the cause is not known. Abdominal pain can be a sign that labor is starting. It can also be caused by normal growth and stretching of muscles and ligaments during pregnancy. Always tell your health care provider if you have any abdominal pain. Follow these instructions at home:  Do not have sex or put anything in your vagina until your pain goes away completely.  Get plenty of rest until your pain improves.  Drink enough fluid to keep your urine pale yellow.  Take over-the-counter and prescription medicines only as told by your health care provider.  Keep all follow-up visits as told by your health care provider. This is important. Contact a health care provider if:  Your pain continues or gets worse after resting.  You have lower abdominal pain that: ? Comes and goes at regular intervals. ? Spreads to your back. ? Is similar to menstrual cramps.  You have pain or burning when you urinate. Get help right away if:  You have a fever or chills.  You have vaginal bleeding.  You are leaking fluid from your vagina.  You are passing tissue from your vagina.  You have vomiting or diarrhea that lasts for more than 24 hours.  Your baby is moving less than usual.  You feel very weak or faint.  You have shortness of breath.  You develop severe pain in your upper abdomen. Summary  Abdominal pain is common during pregnancy, and has many possible causes.  If you experience abdominal pain during pregnancy, tell your health care provider right away.  Follow your health care provider's home care instructions and keep all follow-up visits as  directed. This information is not intended to replace advice given to you by your health care provider. Make sure you discuss any questions you have with your health care provider. Document Released: 06/07/2005 Document Revised: 09/09/2016 Document Reviewed: 09/09/2016 Elsevier Interactive Patient Education  2019 Elsevier Inc.   Subchorionic Hematoma  A subchorionic hematoma is a gathering of blood between the outer wall of the embryo (chorion) and the inner wall of the womb (uterus). This condition can cause vaginal bleeding. If they cause little or no vaginal bleeding, early small hematomas usually shrink on their own and do not affect your baby or pregnancy. When bleeding starts later in pregnancy, or if the hematoma is larger or occurs in older pregnant women, the condition may be more serious. Larger hematomas may get bigger, which increases the chances of miscarriage. This condition also increases the risk of:  Premature separation of the placenta from the uterus.  Premature (preterm) labor.  Stillbirth. What are the causes? The exact cause of this condition is not known. It occurs when blood is trapped between the placenta and the uterine wall because the placenta has separated from the original site of implantation. What increases the risk? You are more likely to develop this condition if:  You were treated with fertility medicines.  You conceived through in vitro fertilization (IVF). What are the signs or symptoms? Symptoms of this condition include:  Vaginal spotting or bleeding.  Contractions of the uterus. These cause abdominal pain. Sometimes you may have no symptoms and the bleeding may only be seen when ultrasound images are taken (transvaginal ultrasound). How is this diagnosed? This condition is diagnosed based on a physical exam. This includes a pelvic exam. You may also have other tests, including:  Blood tests.  Urine tests.  Ultrasound of the abdomen. How  is this treated? Treatment for this condition can vary. Treatment may include:  Watchful waiting. You will be monitored closely for any changes in bleeding. During this stage: ? The hematoma may be reabsorbed by the body. ? The hematoma may separate the fluid-filled space containing the embryo (gestational sac) from the wall of the womb (endometrium).  Medicines.  Activity restriction. This may be needed until the bleeding stops. Follow these instructions at home:  Stay on bed rest if told to do so by your health care provider.  Do not lift anything that is heavier than 10 lbs. (4.5 kg) or as told by your health care provider.  Do not use any products that contain nicotine or tobacco, such as cigarettes and e-cigarettes. If you need help quitting, ask your health care provider.  Track and write down the number of pads you use each day and how soaked (saturated) they are.  Do not use tampons.  Keep all follow-up visits as told by your health care provider. This is important. Your health care provider may ask you to have follow-up blood tests or ultrasound tests or both. Contact a health care provider if:  You have any  vaginal bleeding.  You have a fever. Get help right away if:  You have severe cramps in your stomach, back, abdomen, or pelvis.  You pass large clots or tissue. Save any tissue for your health care provider to look at.  You have more vaginal bleeding, and you faint or become lightheaded or weak. Summary  A subchorionic hematoma is a gathering of blood between the outer wall of the placenta and the uterus.  This condition can cause vaginal bleeding.  Sometimes you may have no symptoms and the bleeding may only be seen when ultrasound images are taken.  Treatment may include watchful waiting, medicines, or activity restriction. This information is not intended to replace advice given to you by your health care provider. Make sure you discuss any questions you  have with your health care provider. Document Released: 09/22/2006 Document Revised: 08/03/2016 Document Reviewed: 08/03/2016 Elsevier Interactive Patient Education  2019 [employer].

## 2018-07-19 NOTE — MAU Note (Signed)
Pt called, not in lobby 

## 2018-07-19 NOTE — MAU Note (Deleted)
Not enough urine for U/A 

## 2018-07-19 NOTE — MAU Provider Note (Signed)
History     CSN: [id number]  Arrival date and time: 07/19/18 1246   First Provider Initiated Contact with Patient 07/19/18 1507      Chief Complaint  Patient presents with  . Abdominal Pain  . Back Pain   HPI   Kayla [name] is a [age] y.o. female [phone] @[redacted]w[redacted]d  is here today with pressure. The pressure started a few days ago. The pressure is every day. She feels it 1-2 times per day. No bleeding. The pressure is in the center of her abdomen.  + Lower back pain, no flank pain. No urinary symptoms or fever. She has not taken any medications for the symptoms.   OB History    Gravida  4   Para  1   Term  1   Preterm      AB  2   Living  1     SAB      TAB  2   Ectopic      Multiple  0   Live Births  1           Past Medical History:  Diagnosis Date  . HSV (herpes simplex virus) infection   . Medical history non-contributory     Past Surgical History:  Procedure Laterality Date  . CESAREAN SECTION N/A 11/06/2016   Procedure: CESAREAN SECTION;  Surgeon: Hermina StaggersErvin, Michael L, MD;  [facility];  Service: Obstetrics;  Laterality: N/A;    Family History  Problem Relation Age of Onset  . Heart disease Father   . Hypertension Father   . Cancer Maternal Grandmother        leukemia  . Diabetes Maternal Grandmother   . Hyperlipidemia Maternal Grandfather   . Heart disease Paternal Grandmother     Social History   Tobacco Use  . Smoking status: Former Games developermoker  . Smokeless tobacco: Former Engineer, waterUser  Substance Use Topics  . Alcohol use: Yes    Alcohol/week: 3.0 standard drinks    Types: 3 Standard drinks or equivalent per week    Comment: daily use-wine  . Drug use: No    Allergies: No Known Allergies  Medications Prior to Admission  Medication Sig Dispense Refill Last Dose  . Prenat-FeAsp-Meth-FA-DHA w/o A (PRENATE PIXIE) 10-0.6-0.4-200 MG CAPS Take 1 tablet by mouth daily. 30 capsule 12 07/18/2018 at Unknown time  . ibuprofen (ADVIL,MOTRIN)  600 MG tablet Take 1 tablet (600 mg total) by mouth every 6 (six) hours. (Patient not taking: Reported on 07/19/2018) 30 tablet 0 Completed Course at Unknown time  . Norethindrone-Ethinyl Estradiol-Fe Biphas (LO LOESTRIN FE) 1 MG-10 MCG / 10 MCG tablet Take 1 tablet by mouth daily. (Patient not taking: Reported on 07/19/2018) 1 Package 11 Not Taking at Unknown time  . oxyCODONE (OXY IR/ROXICODONE) 5 MG immediate release tablet Take 1 tablet (5 mg total) by mouth every 4 (four) hours as needed (pain scale 4-7). (Patient not taking: Reported on 07/19/2018) 30 tablet 0 Completed Course at Unknown time  . valACYclovir (VALTREX) 1000 MG tablet Take 1 tablet (1,000 mg total) by mouth 2 (two) times daily. (Patient not taking: Reported on 07/19/2018) 20 tablet 0 Completed Course at Unknown time   Results for orders placed or performed during the hospital encounter of 07/19/18 (from the past 48 hour(s))  Urinalysis, Routine w reflex microscopic     Status: Abnormal   Collection Time: 07/19/18  1:13 PM  Result Value Ref Range   Color, Urine YELLOW YELLOW   APPearance CLEAR  CLEAR   Specific Gravity, Urine 1.026 1.005 - 1.030   pH 6.0 5.0 - 8.0   Glucose, UA NEGATIVE NEGATIVE mg/dL   Hgb urine dipstick NEGATIVE NEGATIVE   Bilirubin Urine NEGATIVE NEGATIVE   Ketones, ur 5 (A) NEGATIVE mg/dL   Protein, ur NEGATIVE NEGATIVE mg/dL   Nitrite POSITIVE (A) NEGATIVE   Leukocytes, UA NEGATIVE NEGATIVE   RBC / HPF 0-5 0 - 5 RBC/hpf   WBC, UA 0-5 0 - 5 WBC/hpf   Bacteria, UA FEW (A) NONE SEEN   Squamous Epithelial / LPF 0-5 0 - 5   Mucus PRESENT     Comment: Performed at [facility], [address], [place], [place] [address]  Pregnancy, urine POC     Status: Abnormal   Collection Time: 07/19/18  1:25 PM  Result Value Ref Range   Preg Test, Ur POSITIVE (A) NEGATIVE    Comment:        THE SENSITIVITY OF THIS METHODOLOGY IS >24 mIU/mL   CBC with Differential/Platelet     Status: None   Collection Time:  07/19/18  2:23 PM  Result Value Ref Range   WBC 7.4 4.0 - 10.5 K/uL   RBC 4.52 3.87 - 5.11 MIL/uL   Hemoglobin 12.5 12.0 - 15.0 g/dL   HCT 81.0 17.5 - 10.2 %   MCV 80.5 80.0 - 100.0 fL   MCH 27.7 26.0 - 34.0 pg   MCHC 34.3 30.0 - 36.0 g/dL   RDW 58.5 27.7 - 82.4 %   Platelets 280 150 - 400 K/uL   nRBC 0.0 0.0 - 0.2 %   Neutrophils Relative % 56 %   Neutro Abs 4.2 1.7 - 7.7 K/uL   Lymphocytes Relative 39 %   Lymphs Abs 2.9 0.7 - 4.0 K/uL   Monocytes Relative 4 %   Monocytes Absolute 0.3 0.1 - 1.0 K/uL   Eosinophils Relative 1 %   Eosinophils Absolute 0.1 0.0 - 0.5 K/uL   Basophils Relative 0 %   Basophils Absolute 0.0 0.0 - 0.1 K/uL    Comment: Performed at [facility], [address], [place], [place] [address]  hCG, quantitative, pregnancy     Status: Abnormal   Collection Time: 07/19/18  2:23 PM  Result Value Ref Range   hCG, Beta Chain, Quant, S 6,456 (H) <5 mIU/mL    Comment:          GEST. AGE      CONC.  (mIU/mL)   <=1 WEEK        5 - 50     2 WEEKS       50 - 500     3 WEEKS       100 - 10,000     4 WEEKS     1,000 - 30,000     5 WEEKS     3,500 - 115,000   6-8 WEEKS     12,000 - 270,000    12 WEEKS     15,000 - 220,000        FEMALE AND NON-PREGNANT FEMALE:     LESS THAN 5 mIU/mL Performed at [facility], [address] [place], [place] [address]   HIV Antibody (routine testing w rflx)     Status: None   Collection Time: 07/19/18  2:23 PM  Result Value Ref Range   HIV Screen 4th Generation wRfx Non Reactive Non Reactive    Comment: (NOTE) Performed At: [facility] [address] [place], [place]  [id number] Jolene SchimkeNagendra Sanjai MD YQ:[id number]:[phone]   Wet prep, genital     Status: Abnormal   Collection Time: 07/19/18  3:24 PM  Result Value Ref Range   Yeast Wet Prep HPF POC NONE SEEN NONE SEEN   Trich, Wet Prep NONE SEEN NONE SEEN   Clue Cells Wet Prep HPF POC PRESENT (A) NONE SEEN   WBC, Wet Prep HPF POC FEW (A) NONE SEEN    Comment: MANY  BACTERIA SEEN   Sperm NONE SEEN     Comment: Performed at [facility], [address] [place], [place] [address]   Review of Systems  Gastrointestinal: Positive for abdominal pain.  Genitourinary: Negative for dysuria.   Physical Exam   Blood pressure 127/83, pulse 86, temperature 98.1 F (36.7 C), temperature source Oral, resp. rate 18, height 5' 2.5" (1.588 m), weight 111.1 kg, last menstrual period 06/12/2018, not currently breastfeeding.  Physical Exam  Constitutional: She is oriented to person, place, and time. She appears well-developed and well-nourished. No distress.  HENT:  Head: Normocephalic.  Eyes: Pupils are equal, round, and reactive to light.  Respiratory: Effort normal.  GI: Soft. Normal appearance. There is abdominal tenderness in the suprapubic area. There is no rigidity, no rebound, no guarding and no CVA tenderness.  Musculoskeletal: Normal range of motion.  Neurological: She is alert and oriented to person, place, and time.  Skin: Skin is warm. She is not diaphoretic.  Psychiatric: Her behavior is normal.    MAU Course  Procedures None  MDM  O positive blood type  Wet prep & GC HIV, CBC, Hcg, ABO US OB transvaginal  Urine culture pending + Nitrates with pelvic pressure and pain near bladder. Will treat for UTI in pregnancy.   Assessment and Plan   A:  1. Pregnancy of unknown anatomic location   2. Abdominal pain in pregnancy, first trimester   3. Abdominal pressure   4. Urinary tract infection in mother during pregnancy, antepartum   5. Encounter for supervision of normal pregnancy, antepartum, unspecified gravidity     P:  Discharge home in stable condition Rx: Keflex  ? [place], US scheduled for 7 days. They will call you to schedule. [facility].  Strict return precautions  Pelvic rest    Kayla [name], Harolyn RutherfordJennifer I, NP 07/20/2018 11:45 AM

## 2018-07-19 NOTE — MAU Note (Signed)
Pt C/O back & lower abdominal pain x 2 days, is 3-4 days late with period.

## 2018-07-20 LAB — GC/CHLAMYDIA PROBE AMP (~~LOC~~) NOT AT ARMC
CHLAMYDIA, DNA PROBE: NEGATIVE
Neisseria Gonorrhea: NEGATIVE

## 2018-07-20 LAB — HIV ANTIBODY (ROUTINE TESTING W REFLEX): HIV SCREEN 4TH GENERATION: NONREACTIVE

## 2018-07-26 ENCOUNTER — Telehealth: Payer: Self-pay | Admitting: *Deleted

## 2018-07-27 ENCOUNTER — Ambulatory Visit (HOSPITAL_COMMUNITY)
Admission: RE | Admit: 2018-07-27 | Discharge: 2018-07-27 | Disposition: A | Discharge status: Home / Self Care | Payer: Medicaid Other | Source: Ambulatory Visit | Attending: Obstetrics and Gynecology | Admitting: Obstetrics and Gynecology

## 2018-07-27 ENCOUNTER — Ambulatory Visit (HOSPITAL_COMMUNITY): Payer: Medicaid Other

## 2018-07-27 ENCOUNTER — Ambulatory Visit (INDEPENDENT_AMBULATORY_CARE_PROVIDER_SITE_OTHER): Payer: Medicaid Other | Admitting: General Practice

## 2018-07-27 DIAGNOSIS — O3680X0 Pregnancy with inconclusive fetal viability, not applicable or unspecified: Secondary | ICD-10-CM | POA: Diagnosis present

## 2018-07-27 DIAGNOSIS — Z712 Person consulting for explanation of examination or test findings: Secondary | ICD-10-CM

## 2018-07-27 NOTE — Progress Notes (Signed)
Patient presents to office today for viability ultrasound results. Per Dr Marice Potter, ultrasound reveals single living IUP- patient should begin prenatal care.   Informed patient of results, provided pictures, & reviewed dating. Patient verbalized understanding & reports taking PNV daily. Advised patient to begin prenatal care. Patient verbalized understanding & would like to start care here rather than [facility] office.   Chase Caller RN BSN 07/27/18

## 2018-07-31 NOTE — Progress Notes (Signed)
I have reviewed this chart and agree with the RN/CMA assessment and management.    Amad Mau C Klaryssa Fauth, MD, FACOG Attending Physician, Faculty Practice [facility]  

## 2018-08-24 ENCOUNTER — Encounter: Payer: Self-pay | Admitting: Family Medicine

## 2018-08-24 ENCOUNTER — Encounter: Payer: Self-pay | Admitting: Obstetrics and Gynecology

## 2018-08-24 ENCOUNTER — Ambulatory Visit (INDEPENDENT_AMBULATORY_CARE_PROVIDER_SITE_OTHER): Payer: Medicaid Other | Admitting: Obstetrics and Gynecology

## 2018-08-24 VITALS — BP 121/79 | HR 83 | Wt 234.4 lb

## 2018-08-24 DIAGNOSIS — O99011 Anemia complicating pregnancy, first trimester: Secondary | ICD-10-CM

## 2018-08-24 DIAGNOSIS — O34219 Maternal care for unspecified type scar from previous cesarean delivery: Secondary | ICD-10-CM

## 2018-08-24 DIAGNOSIS — O99210 Obesity complicating pregnancy, unspecified trimester: Secondary | ICD-10-CM

## 2018-08-24 DIAGNOSIS — O99211 Obesity complicating pregnancy, first trimester: Secondary | ICD-10-CM

## 2018-08-24 DIAGNOSIS — Z3400 Encounter for supervision of normal first pregnancy, unspecified trimester: Secondary | ICD-10-CM

## 2018-08-24 DIAGNOSIS — Z3490 Encounter for supervision of normal pregnancy, unspecified, unspecified trimester: Secondary | ICD-10-CM | POA: Insufficient documentation

## 2018-08-24 DIAGNOSIS — O99019 Anemia complicating pregnancy, unspecified trimester: Secondary | ICD-10-CM

## 2018-08-24 DIAGNOSIS — Z8759 Personal history of other complications of pregnancy, childbirth and the puerperium: Secondary | ICD-10-CM

## 2018-08-24 DIAGNOSIS — Z3A10 10 weeks gestation of pregnancy: Secondary | ICD-10-CM

## 2018-08-24 DIAGNOSIS — D582 Other hemoglobinopathies: Secondary | ICD-10-CM | POA: Insufficient documentation

## 2018-08-24 MED ORDER — DOCUSATE SODIUM 100 MG PO CAPS: 100.0000 mg | ORAL_CAPSULE | Freq: Two times a day (BID) | ORAL | 1 refills | Status: DC

## 2018-08-24 MED ORDER — ASPIRIN EC 81 MG PO TBEC: 81.0000 mg | DELAYED_RELEASE_TABLET | Freq: Every day | ORAL | 2 refills | Status: DC

## 2018-08-24 NOTE — Progress Notes (Signed)
History:   Kayla [name] is a 33 y.o. S0F0932 at [redacted]w[redacted]d by LMP being seen today for her first obstetrical visit.  Her obstetrical history is significant for obesity and pregnancy induced hypertension. Patient does intend to breast feed. Pregnancy history fully reviewed.  Highly desired pregnancy, here today with supportive husband and daughter Dream.   Patient reports no complaints.      HISTORY: OB History  Gravida Para Term Preterm AB Living  4 1 1  0 2 1  SAB TAB Ectopic Multiple Live Births  0 2 0 0 1    # Outcome Date GA Lbr Len/2nd Weight Sex Delivery Anes PTL Lv  4 Current           3 Term 11/06/16 105w1d  6 lb 13.5 oz (3.105 kg) F CS-LTranv EPI  LIV     Name: Zahn,GIRL Jonice     Apgar1: 8  Apgar5: 9  2 TAB 2012          1 TAB 2011            Last pap smear was done 7/18 and was normal  Past Medical History:  Diagnosis Date  . HSV (herpes simplex virus) infection   . Medical history non-contributory    Past Surgical History:  Procedure Laterality Date  . CESAREAN SECTION N/A 11/06/2016   Procedure: CESAREAN SECTION;  Surgeon: Hermina Staggers, MD;  Location: [facility] BIRTHING SUIT[facility];  Service: Obstetrics;  Laterality: N/A;   Family History  Problem Relation Age of Onset  . Sickle cell anemia Mother   . Heart disease Father   . Hypertension Father   . Cancer Maternal Grandmother        leukemia  . Diabetes Maternal Grandmother   . Hyperlipidemia Maternal Grandfather   . Heart disease Paternal Grandmother    Social History   Tobacco Use  . Smoking status: Former Games developer  . Smokeless tobacco: Former Engineer, water Use Topics  . Alcohol use: Yes    Alcohol/week: 3.0 standard drinks    Types: 3 Standard drinks or equivalent per week    Comment: daily use-wine  . Drug use: No   No Known Allergies Current Outpatient Medications on File Prior to Visit  Medication Sig Dispense Refill  . Prenat-FeAsp-Meth-FA-DHA w/o A (PRENATE PIXIE) 10-0.6-0.4-200 MG CAPS Take 1  tablet by mouth daily. 30 capsule 12  . cephALEXin (KEFLEX) 500 MG capsule Take 1 capsule (500 mg total) by mouth 4 (four) times daily. (Patient not taking: Reported on 08/24/2018) 28 capsule 0   No current facility-administered medications on file prior to visit.     Review of Systems Pertinent items noted in HPI and remainder of comprehensive ROS otherwise negative. Physical Exam:   Vitals:   08/24/18 0901  BP: 121/79  Pulse: 83  Weight: 234 lb 6.4 oz (106.3 kg)     Uterus:     System: General: well-developed, well-nourished female in no acute distress   Skin: normal coloration and turgor, no rashes   Neurologic: oriented, normal, negative, normal mood   Extremities: normal strength, tone, and muscle mass, ROM of all joints is normal   HEENT PERRLA, extraocular movement intact and sclera clear, anicteric   Mouth/Teeth mucous membranes moist, pharynx normal without lesions and dental hygiene good   Neck supple and no masses   Cardiovascular: regular rate and rhythm   Respiratory:  no respiratory distress, normal breath sounds   Abdomen: soft, non-tender; bowel sounds normal; no masses,  no  organomegaly  Bedside Ultrasound for FHR check: Patient informed that the ultrasound is considered a limited obstetric ultrasound and is not intended to be a complete ultrasound exam.  Patient also informed that the ultrasound is not being completed with the intent of assessing for fetal or placental anomalies or any pelvic abnormalities.  Explained that the purpose of today's ultrasound is to assess for fetal heart rate.  Patient acknowledges the purpose of the exam and the limitations of the study.     Assessment:    Pregnancy: [id number] Patient Active Problem List   Diagnosis Date Noted  . Anemia in pregnancy, unspecified trimester 08/24/2018  . Supervision of low-risk pregnancy, unspecified trimester 08/24/2018  . Obesity in pregnancy, antepartum 08/24/2018  . 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 History of cesarean delivery, currently pregnant 12/02/2016  . History of gestational hypertension 11/06/2016     Plan:   1. Supervision of low-risk pregnancy, unspecified trimester  - SMN1 COPY NUMBER ANALYSIS (SMA Carrier Screen) - Prenatal Panel - Enroll patient in Babyscripts Program  2. Anemia in pregnancy, unspecified trimester  - Prenatal panel   3. Supervision of low-risk pregnancy, unspecified trimester  - HgB A1c - Urine Culture - Protein / creatinine ratio, urine  4. Obesity in pregnancy, antepartum  - BASA  - HgB A1c  5. Hemoglobin C trait (HCC)   6. History of cesarean delivery, currently pregnant  Desires TOLAC   7. History of gestational hypertension  - Protein / creatinine ratio, urine   Initial labs drawn. Continue prenatal vitamins. Genetic Screening discussed, NIPS: requested. Ultrasound discussed; fetal anatomic survey: ordered. Problem list reviewed and updated. The nature of [facility] with multiple MDs and other Advanced Practice Providers was explained to patient; also emphasized that residents, students are part of our team. Routine obstetric precautions reviewed. Return in about 1 week (around 08/31/2018) for for NIPS only- please print dental note.  Patient requests NIPS in one week.    , Harolyn Rutherford, NP  [facility] for [employer], [facility]

## 2018-08-24 NOTE — Patient Instructions (Signed)

## 2018-08-25 LAB — URINE CULTURE

## 2018-08-30 ENCOUNTER — Other Ambulatory Visit: Payer: Self-pay | Admitting: *Deleted

## 2018-08-30 DIAGNOSIS — Z3400 Encounter for supervision of normal first pregnancy, unspecified trimester: Secondary | ICD-10-CM

## 2018-08-31 ENCOUNTER — Other Ambulatory Visit: Payer: Medicaid Other

## 2018-08-31 ENCOUNTER — Other Ambulatory Visit: Payer: Self-pay

## 2018-08-31 DIAGNOSIS — O99210 Obesity complicating pregnancy, unspecified trimester: Secondary | ICD-10-CM

## 2018-08-31 DIAGNOSIS — Z8759 Personal history of other complications of pregnancy, childbirth and the puerperium: Secondary | ICD-10-CM

## 2018-08-31 DIAGNOSIS — O99019 Anemia complicating pregnancy, unspecified trimester: Secondary | ICD-10-CM

## 2018-08-31 DIAGNOSIS — O34219 Maternal care for unspecified type scar from previous cesarean delivery: Secondary | ICD-10-CM

## 2018-08-31 DIAGNOSIS — D582 Other hemoglobinopathies: Secondary | ICD-10-CM

## 2018-08-31 DIAGNOSIS — Z3490 Encounter for supervision of normal pregnancy, unspecified, unspecified trimester: Secondary | ICD-10-CM

## 2018-08-31 DIAGNOSIS — Z3400 Encounter for supervision of normal first pregnancy, unspecified trimester: Secondary | ICD-10-CM

## 2018-09-04 LAB — RPR+RH+ABO+RUB AB+AB SCR+CB...
Antibody Screen: NEGATIVE
HEP B S AG: NEGATIVE
HIV Screen 4th Generation wRfx: NONREACTIVE
Hematocrit: 37.8 % (ref 34.0–46.6)
Hemoglobin: 12.4 g/dL (ref 11.1–15.9)
MCH: 27.1 pg (ref 26.6–33.0)
MCHC: 32.8 g/dL (ref 31.5–35.7)
MCV: 83 fL (ref 79–97)
Platelets: 379 10*3/uL (ref 150–450)
RBC: 4.58 x10E6/uL (ref 3.77–5.28)
RDW: 13 % (ref 11.7–15.4)
RPR Ser Ql: NONREACTIVE
Rh Factor: POSITIVE
Rubella Antibodies, IGG: 4.32 index (ref >0.99)
VARICELLA: 1809 {index} (ref >165)
WBC: 7.9 10*3/uL (ref 3.4–10.8)

## 2018-09-04 LAB — SMN1 COPY NUMBER ANALYSIS (SMA CARRIER SCREENING)

## 2018-09-04 LAB — PROTEIN / CREATININE RATIO, URINE
CREATININE, UR: 220.2 mg/dL
PROTEIN/CREAT RATIO: 63 mg/g{creat} (ref 0–200)
Protein, Ur: 13.9 mg/dL

## 2018-09-04 LAB — HEMOGLOBIN A1C
Est. average glucose Bld gHb Est-mCnc: 100 mg/dL
Hgb A1c MFr Bld: 5.1 % (ref 4.8–5.6)

## 2018-09-05 ENCOUNTER — Telehealth: Payer: Self-pay | Admitting: *Deleted

## 2018-09-05 ENCOUNTER — Encounter: Payer: Self-pay | Admitting: *Deleted

## 2018-09-05 NOTE — Telephone Encounter (Signed)
Kayla [name] left a voicemail this afternoon stating she is trying to write a message to [facility] on MyChart but it won't let her. States she is " under the weather", and has a cough.States her daughter who is 1 was diagnosed with Flu B and states she is in her face all the time. States she is fine, and is taking what pharmacy recommended. States since she is pregnant is there any protocol I should take?

## 2018-09-06 NOTE — Telephone Encounter (Signed)
LM on VM stating that I am returning her call to please give the office a call back.

## 2018-09-06 NOTE — Telephone Encounter (Signed)
Pt returned call back.  I informed her that at this time I would cause concern however to treat the symptoms as she is currently doing.  I gave pt more OTC medications that she can try to take as well.  I advised pt that if her sx's get worse like a fever, SOB to please give the office a call and to not come to the office.  Pt verbalized understanding.

## 2018-09-21 ENCOUNTER — Ambulatory Visit (INDEPENDENT_AMBULATORY_CARE_PROVIDER_SITE_OTHER): Payer: Medicaid Other | Admitting: Obstetrics and Gynecology

## 2018-09-21 ENCOUNTER — Other Ambulatory Visit: Payer: Self-pay

## 2018-09-21 DIAGNOSIS — O34219 Maternal care for unspecified type scar from previous cesarean delivery: Secondary | ICD-10-CM | POA: Diagnosis not present

## 2018-09-21 DIAGNOSIS — Z3A14 14 weeks gestation of pregnancy: Secondary | ICD-10-CM

## 2018-09-21 DIAGNOSIS — O09292 Supervision of pregnancy with other poor reproductive or obstetric history, second trimester: Secondary | ICD-10-CM

## 2018-09-21 DIAGNOSIS — Z8759 Personal history of other complications of pregnancy, childbirth and the puerperium: Secondary | ICD-10-CM

## 2018-09-21 DIAGNOSIS — Z3490 Encounter for supervision of normal pregnancy, unspecified, unspecified trimester: Secondary | ICD-10-CM

## 2018-09-21 DIAGNOSIS — Z3400 Encounter for supervision of normal first pregnancy, unspecified trimester: Secondary | ICD-10-CM

## 2018-09-21 NOTE — Progress Notes (Signed)
   PRENATAL VISIT NOTE  Subjective:  Kayla [name] is a 33 y.o. M1D6222 at [redacted]w[redacted]d being seen today for ongoing prenatal care.  She is currently monitored for the following issues for this low-risk pregnancy and has History of gestational hypertension; History of cesarean delivery, currently pregnant; Anemia in pregnancy, unspecified trimester; Supervision of low-risk pregnancy, unspecified trimester; Obesity in pregnancy, antepartum; and Hemoglobin C trait (HCC) on their problem list.  Patient reports no complaints.  Contractions: Not present. Vag. Bleeding: None.  Movement: Absent. Denies leaking of fluid.   The following portions of the patient's history were reviewed and updated as appropriate: allergies, current medications, past family history, past medical history, past social history, past surgical history and problem list.   Objective:  There were no vitals filed for this visit.  Fetal Status:     Movement: Absent     General:  Alert, oriented and cooperative. Patient is in no acute distress.  Skin: Skin is warm and dry. No rash noted.   Cardiovascular: Normal heart rate noted  Respiratory: Normal respiratory effort, no problems with respiration noted  Abdomen: Soft, gravid, appropriate for gestational age.  Pain/Pressure: Present     Pelvic: Cervical exam deferred        Extremities: Normal range of motion.  Edema: None  Mental Status: Normal mood and affect. Normal behavior. Normal judgment and thought content.   Assessment and Plan:  Pregnancy: G4P1021 at [redacted]w[redacted]d 1. Encounter for supervision of low-risk first pregnancy, antepartum  - Virtual visit today; Doing well - [place] MFM OB DETAIL +14 WK; Future - [facility] AMB BABYSCRIPTS SCHEDULE OPTIMIZATION - she will stop by today and pick up BP cuff.  - She will need to come in for AFP, she is aware and will call to schedule  - Continue BASA  - Desires TOLAC-Will sign consent at 28 week visit   Preterm labor symptoms and general obstetric  precautions including but not limited to vaginal bleeding, contractions, leaking of fluid and fetal movement were reviewed in detail with the patient. Please refer to After Visit Summary for other counseling recommendations.   Return in about 4 weeks (around 10/19/2018).  Future Appointments  Date Time Provider Department Center  10/24/2018 10:30 AM WH-MFC [place] 5 WH-MFCUS MFC-US  10/24/2018 10:35 AM WH-MFC NURSE WH-MFC MFC-US    Venia Carbon, NP

## 2018-09-21 NOTE — Progress Notes (Signed)
I connected with  Kayla [name] on 09/21/18 at  9:35 AM EDT by telephone and verified that I am speaking with the correct person using two identifiers.   I discussed the limitations, risks, security and privacy concerns of performing an evaluation and management service by telephone and the availability of in person appointments. I also discussed with the patient that there may be a patient responsible charge related to this service. The patient expressed understanding and agreed to proceed.  Ernestina Patches, CMA 09/21/2018  9:37 AM

## 2018-09-21 NOTE — Patient Instructions (Addendum)
New OB office  [address] [place]. [address]   [facility] [address]nd [address]  [place], [place] [address]     CIRCUMCISION  Circumcision is considered an elective/non-medically necessary procedure. There are many reasons parents decide to have their sons circumsized. During the first year of life circumcised males have a reduced risk of urinary tract infections but after this year the rates between circumcised males and uncircumcised males are the same.  It is safe to have your son circumcised outside of the hospital and the places above perform them regularly.    Places to have your son circumcised:    [facility] [phone] $480 by 4 wks  [facility] [phone] $244 by 4 wks  [facility] [phone] $175 by 2 wks  [facility] [phone] $250 by 7 days [facility] [phone] $150 by 4 wks  These prices sometimes change but are roughly what you can expect to pay. Please call and confirm pricing.     Deciding about Circumcision in Baby Boys  (The Basics)  What is circumcision?  Circumcision is a surgery that removes the skin that covers the tip of the penis, called the "foreskin" Circumcision is usually done when a boy is between 60 and 26 days old. In the [place], circumcision is common. In some other countries, fewer boys are circumcised. Circumcision is a common tradition in some religions.  Should I have my baby boy circumcised?  There is no easy answer. Circumcision has some benefits. But it also has risks. After talking with your doctor, you will have to decide for yourself what is right for your family.  What are the benefits of circumcision?  Circumcised boys seem to have slightly lower rates of: ?Urinary tract infections ?Swelling of the opening at the tip of  the penis Circumcised men seem to have slightly lower rates of: ?Urinary tract infections ?Swelling of the opening at the tip of the penis ?Penis cancer ?HIV and other infections that you catch during sex ?Cervical cancer in the women they have sex with Even so, in the [place], the risks of these problems are small - even in boys and men who have not been circumcised. Plus, boys and men who are not circumcised can reduce these extra risks by: ?Cleaning their penis well ?Using condoms during sex  What are the risks of circumcision?  Risks include: ?Bleeding or infection from the surgery ?Damage to or amputation of the penis ?A chance that the doctor will cut off too much or not enough of the foreskin ?A chance that sex won't feel as good later in life Only about 1 out of every 200 circumcisions leads to problems. There is also a chance that your health insurance won't pay for circumcision.  How is circumcision done in baby boys?  First, the baby gets medicine for pain relief. This might be a cream on the skin or a shot into the base of the penis. Next, the doctor cleans the baby's penis well. Then he or she uses special tools to cut off the foreskin. Finally, the doctor wraps a bandage (called gauze) around the baby's penis. If you have your baby circumcised, his doctor or nurse will give you instructions on how to care for him after the surgery. It is important that you follow those instructions carefully.

## 2018-09-26 ENCOUNTER — Encounter: Payer: Self-pay | Admitting: *Deleted

## 2018-10-05 ENCOUNTER — Telehealth: Payer: Self-pay | Admitting: General Practice

## 2018-10-05 NOTE — Telephone Encounter (Signed)
Per review, patient hasn't entered any BP in babyscripts in 2 weeks. Called patient, no answer- left message stating we are trying to reach you as we haven't had any blood pressures on you in a couple weeks. Please check your blood pressure today and enter it into babyscripts. Also call us back if you have questions/concerns. Will send mychart message

## 2018-10-17 ENCOUNTER — Telehealth: Payer: Self-pay | Admitting: Student

## 2018-10-17 NOTE — Telephone Encounter (Signed)
Called the patient to inform of upcoming appointment. The patient has the app downloaded to her phone.

## 2018-10-18 ENCOUNTER — Ambulatory Visit (INDEPENDENT_AMBULATORY_CARE_PROVIDER_SITE_OTHER): Payer: Medicaid Other | Admitting: Medical

## 2018-10-18 ENCOUNTER — Encounter: Payer: Self-pay | Admitting: Medical

## 2018-10-18 ENCOUNTER — Telehealth: Payer: Self-pay | Admitting: Medical

## 2018-10-18 ENCOUNTER — Other Ambulatory Visit: Payer: Self-pay

## 2018-10-18 VITALS — BP 124/69

## 2018-10-18 DIAGNOSIS — Z8759 Personal history of other complications of pregnancy, childbirth and the puerperium: Secondary | ICD-10-CM

## 2018-10-18 DIAGNOSIS — Z3490 Encounter for supervision of normal pregnancy, unspecified, unspecified trimester: Secondary | ICD-10-CM

## 2018-10-18 DIAGNOSIS — O34219 Maternal care for unspecified type scar from previous cesarean delivery: Secondary | ICD-10-CM

## 2018-10-18 DIAGNOSIS — O99012 Anemia complicating pregnancy, second trimester: Secondary | ICD-10-CM

## 2018-10-18 DIAGNOSIS — O99019 Anemia complicating pregnancy, unspecified trimester: Secondary | ICD-10-CM

## 2018-10-18 DIAGNOSIS — Z3A18 18 weeks gestation of pregnancy: Secondary | ICD-10-CM

## 2018-10-18 NOTE — Patient Instructions (Signed)

## 2018-10-18 NOTE — Progress Notes (Signed)
I connected with@ on 10/18/18 at  2:15 PM EDT by: WebEx and verified that I am speaking with the correct person using two identifiers.  Patient is located at home and provider is located at [facility].     The purpose of this virtual visit is to provide medical care while limiting exposure to the novel coronavirus. I discussed the limitations, risks, security and privacy concerns of performing an evaluation and management service by WebEx and the availability of in person appointments. I also discussed with the patient that there may be a patient responsible charge related to this service. By engaging in this virtual visit, you consent to the provision of healthcare.  Additionally, you authorize for your insurance to be billed for the services provided during this visit.  The patient expressed understanding and agreed to proceed.  The following staff members participated in the virtual visit:  Corinda Gubler, CMA    PRENATAL VISIT NOTE  Subjective:  Kayla [name] is a 33 y.o. [id number] at [redacted]w[redacted]d  for phone visit for ongoing prenatal care.  She is currently monitored for the following issues for this low-risk pregnancy and has History of gestational hypertension; History of cesarean delivery, currently pregnant; Anemia in pregnancy, unspecified trimester; Supervision of low-risk pregnancy, unspecified trimester; Obesity in pregnancy, antepartum; and Hemoglobin C trait (HCC) on their problem list.  Patient reports no complaints.  Contractions: Not present. Vag. Bleeding: None.  Movement: Absent. Denies leaking of fluid.   The following portions of the patient's history were reviewed and updated as appropriate: allergies, current medications, past family history, past medical history, past social history, past surgical history and problem list.   Objective:   Vitals:   10/18/18 1439  BP: 124/69   Self-Obtained  Fetal Status:     Movement: Absent     Assessment and Plan:  Pregnancy: G4P1021 at 100w2d 1.  Supervision of low-risk pregnancy, unspecified trimester - Doing well, had fishy odor that resolved, encouraged to call for Rx if returns - Anatomy US scheduled for 5/5  2. Anemia in pregnancy, unspecified trimester - recheck at 28 weeks  3. History of cesarean delivery, currently pregnant - Planning for TOLAC, needs MD appointment at 28 weeks for TOLAC and BTL consent   4. History of gestational hypertension - Normotensive today - Patient has BP cuff through [facility] and is now entering BPs weekly  Preterm labor symptoms and general obstetric precautions including but not limited to vaginal bleeding, contractions, leaking of fluid and fetal movement were reviewed in detail with the patient.  Return in about 4 weeks (around 11/15/2018) for Virtual, LOB.  Future Appointments  Date Time Provider Department Center  10/24/2018 10:30 AM WH-MFC [place] 5 WH-MFCUS MFC-US  10/24/2018 10:35 AM WH-MFC NURSE WH-MFC MFC-US     Time spent on virtual visit: 23 minutes  Vonzella Nipple, PA-C

## 2018-10-18 NOTE — Telephone Encounter (Signed)
Attempted to call patient with her next virtual appointment. No answer, left message with appointment time and date (5/27@ 3:15). Informed that this will be a virtual visit. Advised to give the office a call if needing to reschedule

## 2018-10-19 ENCOUNTER — Encounter: Payer: Medicaid Other | Admitting: Obstetrics and Gynecology

## 2018-10-20 ENCOUNTER — Encounter: Payer: Self-pay | Admitting: *Deleted

## 2018-10-24 ENCOUNTER — Ambulatory Visit (HOSPITAL_COMMUNITY)
Admission: RE | Admit: 2018-10-24 | Discharge: 2018-10-24 | Disposition: A | Discharge status: Home / Self Care | Payer: Medicaid Other | Source: Ambulatory Visit | Attending: Obstetrics and Gynecology | Admitting: Obstetrics and Gynecology

## 2018-10-24 ENCOUNTER — Other Ambulatory Visit: Payer: Self-pay

## 2018-10-24 ENCOUNTER — Encounter (HOSPITAL_COMMUNITY): Payer: Self-pay

## 2018-10-24 ENCOUNTER — Ambulatory Visit (HOSPITAL_COMMUNITY): Payer: Medicaid Other | Admitting: *Deleted

## 2018-10-24 VITALS — BP 119/68 | HR 88 | Temp 98.5°F

## 2018-10-24 DIAGNOSIS — Z3400 Encounter for supervision of normal first pregnancy, unspecified trimester: Secondary | ICD-10-CM

## 2018-10-24 DIAGNOSIS — Z3A19 19 weeks gestation of pregnancy: Secondary | ICD-10-CM | POA: Diagnosis not present

## 2018-10-24 DIAGNOSIS — O34219 Maternal care for unspecified type scar from previous cesarean delivery: Secondary | ICD-10-CM | POA: Diagnosis not present

## 2018-10-24 DIAGNOSIS — O0990 Supervision of high risk pregnancy, unspecified, unspecified trimester: Secondary | ICD-10-CM | POA: Diagnosis present

## 2018-10-24 DIAGNOSIS — Z363 Encounter for antenatal screening for malformations: Secondary | ICD-10-CM

## 2018-10-25 ENCOUNTER — Other Ambulatory Visit (HOSPITAL_COMMUNITY): Payer: Self-pay | Admitting: *Deleted

## 2018-10-25 DIAGNOSIS — Z8759 Personal history of other complications of pregnancy, childbirth and the puerperium: Secondary | ICD-10-CM

## 2018-11-15 ENCOUNTER — Other Ambulatory Visit: Payer: Self-pay

## 2018-11-15 ENCOUNTER — Telehealth (INDEPENDENT_AMBULATORY_CARE_PROVIDER_SITE_OTHER): Payer: Medicaid Other | Admitting: Family Medicine

## 2018-11-15 VITALS — BP 132/65

## 2018-11-15 DIAGNOSIS — Z3A22 22 weeks gestation of pregnancy: Secondary | ICD-10-CM

## 2018-11-15 DIAGNOSIS — Z3482 Encounter for supervision of other normal pregnancy, second trimester: Secondary | ICD-10-CM

## 2018-11-15 DIAGNOSIS — O34219 Maternal care for unspecified type scar from previous cesarean delivery: Secondary | ICD-10-CM

## 2018-11-15 DIAGNOSIS — Z3490 Encounter for supervision of normal pregnancy, unspecified, unspecified trimester: Secondary | ICD-10-CM

## 2018-11-15 NOTE — Progress Notes (Signed)
TELEHEALTH OBSTETRICS PRENATAL VIRTUAL VIDEO VISIT ENCOUNTER NOTE  Provider location: [facility] [employer] at [facility]   I connected with Kayla [name] on 11/15/18 at  3:15 PM EDT by MyChart Video Encounter at home and verified that I am speaking with the correct person using two identifiers.   I discussed the limitations, risks, security and privacy concerns of performing an evaluation and management service by telephone and the availability of in person appointments. I also discussed with the patient that there may be a patient responsible charge related to this service. The patient expressed understanding and agreed to proceed. Subjective:  Kayla [name] is a 33 y.o. [id number] at [redacted]w[redacted]d being seen today for ongoing prenatal care.  She is currently monitored for the following issues for this low-risk pregnancy and has History of gestational hypertension; History of cesarean delivery, currently pregnant; Anemia in pregnancy, unspecified trimester; Supervision of low-risk pregnancy, unspecified trimester; Obesity in pregnancy, antepartum; and Hemoglobin C trait (HCC) on their problem list.  Patient reports no complaints.  Contractions: Not present. Vag. Bleeding: None.  Movement: Present. Denies any leaking of fluid.   The following portions of the patient's history were reviewed and updated as appropriate: allergies, current medications, past family history, past medical history, past social history, past surgical history and problem list.   Objective:   Vitals:   11/15/18 1458  BP: 132/65    Fetal Status:     Movement: Present     General:  Alert, oriented and cooperative. Patient is in no acute distress.  Respiratory: Normal respiratory effort, no problems with respiration noted  Mental Status: Normal mood and affect. Normal behavior. Normal judgment and thought content.  Rest of physical exam deferred due to type of encounter  Imaging: [place] Mfm Ob Detail +14 Wk  Result Date:  10/24/2018 ----------------------------------------------------------------------  OBSTETRICS REPORT                    (Corrected Final 10/24/2018 04:13 pm) ---------------------------------------------------------------------- Patient Info  ID #:       [id number]                          D.O.B.:  Dec 22, 1985 (32 yrs)  Name:       Kayla [name]                      Visit Date: 10/24/2018 11:55 am ---------------------------------------------------------------------- Performed By  Performed By:     [facility]        Ref. Address:     [address]                    [place]                                                             [address]  Attending:        Noralee Space MD        [facility]  Referred By:      [facility] ---------------------------------------------------------------------- Orders   #  Description                          Code         Ordered By   1  [place] MFM OB DETAIL +14 WK              76811.01     [facility]  ----------------------------------------------------------------------   #  Order #                    Accession #                 Episode #   1  [id number]                  [id number]                  [id number]  ---------------------------------------------------------------------- Indications   [redacted] weeks gestation of pregnancy                Z3A.19   Encounter for antenatal screening for          Z36.3   malformations   Previous cesarean delivery, antepartum         O34.219  ---------------------------------------------------------------------- Fetal Evaluation  Num Of Fetuses:         1  Fetal Heart Rate(bpm):  154  Cardiac Activity:       Observed  Presentation:           Cephalic  Placenta:               Anterior  P. Cord Insertion:      Visualized, central  Amniotic Fluid  AFI FV:      Within normal limits                              Largest Pocket(cm)                              3.16  ---------------------------------------------------------------------- Biometry  BPD:      41.8  mm     G. Age:  18w 4d         30  %    CI:        72.78   %    70 - 86                                                          FL/HC:      18.9   %    16.1 - 18.3  HC:      155.8  mm     G. Age:  18w 3d         17  %    HC/AC:      1.20        1.09 - 1.39  AC:      130.3  mm     G. Age:  18w 4d         27  %    FL/BPD:     70.3   %  FL:       29.4  mm     G. Age:  19w 0d         40  %    FL/AC:      22.6   %    20 - 24  HUM:      28.8  mm     G. Age:  19w 2d         56  %  CER:        20  mm     G. Age:  19w 0d         48  %  NFT:       3.6  mm  LV:        7.6  mm  CM:        5.2  mm  Est. FW:     256  gm      0 lb 9 oz     38  % ---------------------------------------------------------------------- OB History  Gravidity:    2  Living:       1 ---------------------------------------------------------------------- Gestational Age  LMP:           19w 1d        Date:  06/12/18                 EDD:   03/19/19  U/S Today:     18w 5d                                        EDD:   03/22/19  Best:          19w 1d     Det. By:  LMP  (06/12/18)          EDD:   03/19/19 ---------------------------------------------------------------------- Anatomy  Cranium:               Appears normal         LVOT:                   Appears normal  Cavum:                 Appears normal         Aortic Arch:            Not well visualized  Ventricles:            Appears normal         Ductal Arch:            Appears normal  Choroid Plexus:        Appears normal         Diaphragm:              Appears normal  Cerebellum:            Appears normal         Stomach:                Appears normal, left  sided  Posterior Fossa:       Appears normal         Abdomen:                Appears normal  Nuchal Fold:           Appears normal         Abdominal Wall:         Appears nml (cord                                                                         insert, abd wall)  Face:                  Appears normal         Cord Vessels:           Appears normal (3                         (orbits and profile)                           vessel cord)  Lips:                  Not well visualized    Kidneys:                Appear normal  Palate:                Appears normal         Bladder:                Appears normal  Thoracic:              Appears normal         Spine:                  Limited views                                                                        appear normal  Heart:                 Appears normal         Upper Extremities:      Appears normal                         (4CH, axis, and                         situs)  RVOT:                  Appears normal         Lower Extremities:      Appears normal  Other:  Heels and 5th digit visualized. Fetus appears to be a female. Open  hands visualized. Technically difficult due to maternal habitus and          fetal position. ---------------------------------------------------------------------- Cervix Uterus Adnexa  Cervix  Not adaquately visualized  Left Ovary  Ovarian cyst (see comments)  Right Ovary  Not visualized. ---------------------------------------------------------------------- Impression  We performed fetal anatomy scan. No makers of  aneuploidies or fetal structural defects are seen. Fetal  biometry is consistent with her previously-established dates.  Amniotic fluid is normal and good fetal activity is seen.  Placenta is anterior and there is no evidence of previa or  accreta.  A small left ovarian simple unilocular cyst measuring 4.2 x 3.3  cm is seen.  Maternal obesity imposes limitations on the resolution of  images, and failure to detect fetal anomalies is more common  in obese pregnant women.  On cell-free fetal DNA screening, the risks of fetal  aneuploidies are not increased.  ---------------------------------------------------------------------- Recommendations  An appointment was made for her to return in 4 weeks for  completion of fetal anatomy. ----------------------------------------------------------------------                       Noralee Spaceavi Shankar, MD Electronically Signed Corrected Final Report  10/24/2018 04:13 pm ----------------------------------------------------------------------   Assessment and Plan:  Pregnancy: [id number] at 2927w2d 1. Supervision of low-risk pregnancy, unspecified trimester Continue routine prenatal care.   2. History of cesarean delivery, currently pregnant Discuss TOLAC vs. RCS at next visit Desires BTL--papers at next visit  Preterm labor symptoms and general obstetric precautions including but not limited to vaginal bleeding, contractions, leaking of fluid and fetal movement were reviewed in detail with the patient. I discussed the assessment and treatment plan with the patient. The patient was provided an opportunity to ask questions and all were answered. The patient agreed with the plan and demonstrated an understanding of the instructions. The patient was advised to call back or seek an in-person office evaluation/[facility] for any urgent or concerning symptoms. Please refer to After Visit Summary for other counseling recommendations.   I provided 16 minutes of face-to-face time during this encounter.  Return in about 5 weeks (around 12/20/2018) [facility] wk [facility].  Future Appointments  Date Time Provider Department Center  11/21/2018 12:30 PM [facility]-MFC NURSE [facility] MFC-US  11/21/2018 12:30 PM [facility] US 5 WH-MFCUS MFC-US    Reva Boresanya S Nestor Wieneke, MD [facility], [facility]

## 2018-11-15 NOTE — Progress Notes (Signed)
I connected with  Kayla [name] on 11/15/18 at  3:15 PM EDT by telephone and verified that I am speaking with the correct person using two identifiers.   I discussed the limitations, risks, security and privacy concerns of performing an evaluation and management service by telephone and virtual and the availability of in person appointments. I also discussed with the patient that there may be a patient responsible charge related to this service. The patient expressed understanding and agreed to proceed. [name] is active in Babyscripts; last BP 124/80 on 11/11/18 and I asked her to take her bp again today.   Jamez Ambrocio,RN 11/15/2018  2:55 PM

## 2018-11-15 NOTE — Patient Instructions (Signed)

## 2018-11-16 ENCOUNTER — Telehealth: Payer: Self-pay | Admitting: Family Medicine

## 2018-11-16 NOTE — Telephone Encounter (Signed)
Attempted to call patient with her follow-up appointments ( 7/1 @ 8:50 - office ). No answer, number rang for a few times then rang busy. Appointment reminder mailed.

## 2018-11-21 ENCOUNTER — Ambulatory Visit (HOSPITAL_COMMUNITY): Payer: Medicaid Other

## 2018-11-21 ENCOUNTER — Encounter (HOSPITAL_COMMUNITY): Payer: Self-pay

## 2018-11-22 ENCOUNTER — Other Ambulatory Visit: Payer: Self-pay

## 2018-11-22 ENCOUNTER — Ambulatory Visit (HOSPITAL_COMMUNITY): Payer: Medicaid Other | Admitting: *Deleted

## 2018-11-22 ENCOUNTER — Encounter (HOSPITAL_COMMUNITY): Payer: Self-pay

## 2018-11-22 ENCOUNTER — Ambulatory Visit (HOSPITAL_COMMUNITY)
Admission: RE | Admit: 2018-11-22 | Discharge: 2018-11-22 | Disposition: A | Discharge status: Home / Self Care | Payer: Medicaid Other | Source: Ambulatory Visit | Attending: Obstetrics and Gynecology | Admitting: Obstetrics and Gynecology

## 2018-11-22 VITALS — BP 122/65 | HR 92 | Temp 98.4°F

## 2018-11-22 DIAGNOSIS — D582 Other hemoglobinopathies: Secondary | ICD-10-CM

## 2018-11-22 DIAGNOSIS — O99210 Obesity complicating pregnancy, unspecified trimester: Secondary | ICD-10-CM

## 2018-11-22 DIAGNOSIS — O99212 Obesity complicating pregnancy, second trimester: Secondary | ICD-10-CM

## 2018-11-22 DIAGNOSIS — Z8759 Personal history of other complications of pregnancy, childbirth and the puerperium: Secondary | ICD-10-CM | POA: Diagnosis present

## 2018-11-22 DIAGNOSIS — O139 Gestational [pregnancy-induced] hypertension without significant proteinuria, unspecified trimester: Secondary | ICD-10-CM

## 2018-11-22 DIAGNOSIS — Z362 Encounter for other antenatal screening follow-up: Secondary | ICD-10-CM

## 2018-11-22 DIAGNOSIS — O34219 Maternal care for unspecified type scar from previous cesarean delivery: Secondary | ICD-10-CM | POA: Diagnosis not present

## 2018-11-22 DIAGNOSIS — Z3490 Encounter for supervision of normal pregnancy, unspecified, unspecified trimester: Secondary | ICD-10-CM | POA: Insufficient documentation

## 2018-11-22 DIAGNOSIS — O99019 Anemia complicating pregnancy, unspecified trimester: Secondary | ICD-10-CM

## 2018-11-22 DIAGNOSIS — Z3A23 23 weeks gestation of pregnancy: Secondary | ICD-10-CM | POA: Diagnosis not present

## 2018-11-23 ENCOUNTER — Other Ambulatory Visit (HOSPITAL_COMMUNITY): Payer: Self-pay | Admitting: *Deleted

## 2018-11-23 DIAGNOSIS — Z8759 Personal history of other complications of pregnancy, childbirth and the puerperium: Secondary | ICD-10-CM

## 2018-12-07 ENCOUNTER — Other Ambulatory Visit: Payer: Medicaid Other

## 2018-12-07 ENCOUNTER — Encounter: Payer: Medicaid Other | Admitting: Family Medicine

## 2018-12-19 ENCOUNTER — Telehealth: Payer: Self-pay | Admitting: Family Medicine

## 2018-12-19 ENCOUNTER — Telehealth: Payer: Self-pay | Admitting: Advanced Practice Midwife

## 2018-12-19 NOTE — Telephone Encounter (Signed)
Attempted to call patient to ask her about any symptoms, and to wear her mask the whole visit covering her nose and mouth, and no visitors.  Also, to sanitize her hands upon arriving. A message was left on her voicemail.  °

## 2018-12-19 NOTE — Telephone Encounter (Signed)
Called the patient to pre-screen. Left a detailed voicemail informing if the patient is experiencing any flu-like symptoms such as fever, chills, cough, or shortness of breath and/or has been in contact with anyone who is suspected of/or confirmed to have COVID19 please call back to reschedule the appointment. Upon entering the office please wear a face mask, sanitize hands, and no visitors or children due to COVID19 restrictions. °

## 2018-12-20 ENCOUNTER — Encounter (HOSPITAL_COMMUNITY): Payer: Self-pay

## 2018-12-20 ENCOUNTER — Ambulatory Visit (HOSPITAL_COMMUNITY)
Admission: RE | Admit: 2018-12-20 | Discharge: 2018-12-20 | Disposition: A | Discharge status: Home / Self Care | Payer: Medicaid Other | Source: Ambulatory Visit | Attending: Obstetrics and Gynecology | Admitting: Obstetrics and Gynecology

## 2018-12-20 ENCOUNTER — Other Ambulatory Visit: Payer: Medicaid Other

## 2018-12-20 ENCOUNTER — Other Ambulatory Visit: Payer: Self-pay | Admitting: *Deleted

## 2018-12-20 ENCOUNTER — Ambulatory Visit (INDEPENDENT_AMBULATORY_CARE_PROVIDER_SITE_OTHER): Payer: Medicaid Other | Admitting: Family Medicine

## 2018-12-20 ENCOUNTER — Ambulatory Visit (HOSPITAL_COMMUNITY): Payer: Medicaid Other | Admitting: *Deleted

## 2018-12-20 ENCOUNTER — Other Ambulatory Visit (HOSPITAL_COMMUNITY): Payer: Self-pay | Admitting: *Deleted

## 2018-12-20 ENCOUNTER — Other Ambulatory Visit: Payer: Self-pay

## 2018-12-20 VITALS — BP 107/58 | HR 91 | Temp 97.9°F | Wt 244.8 lb

## 2018-12-20 DIAGNOSIS — O09292 Supervision of pregnancy with other poor reproductive or obstetric history, second trimester: Secondary | ICD-10-CM

## 2018-12-20 DIAGNOSIS — Z23 Encounter for immunization: Secondary | ICD-10-CM

## 2018-12-20 DIAGNOSIS — R638 Other symptoms and signs concerning food and fluid intake: Secondary | ICD-10-CM

## 2018-12-20 DIAGNOSIS — O0990 Supervision of high risk pregnancy, unspecified, unspecified trimester: Secondary | ICD-10-CM

## 2018-12-20 DIAGNOSIS — Z3490 Encounter for supervision of normal pregnancy, unspecified, unspecified trimester: Secondary | ICD-10-CM

## 2018-12-20 DIAGNOSIS — Z3A27 27 weeks gestation of pregnancy: Secondary | ICD-10-CM

## 2018-12-20 DIAGNOSIS — O34219 Maternal care for unspecified type scar from previous cesarean delivery: Secondary | ICD-10-CM | POA: Diagnosis not present

## 2018-12-20 DIAGNOSIS — O99212 Obesity complicating pregnancy, second trimester: Secondary | ICD-10-CM | POA: Diagnosis not present

## 2018-12-20 DIAGNOSIS — Z8759 Personal history of other complications of pregnancy, childbirth and the puerperium: Secondary | ICD-10-CM

## 2018-12-20 DIAGNOSIS — Z362 Encounter for other antenatal screening follow-up: Secondary | ICD-10-CM

## 2018-12-20 NOTE — Progress Notes (Signed)
244.8 

## 2018-12-20 NOTE — Patient Instructions (Signed)

## 2018-12-20 NOTE — Progress Notes (Signed)
    PRENATAL VISIT NOTE  Subjective:  Kayla [name] is a 33 y.o. G4P1021 at [redacted]w[redacted]d being seen today for ongoing prenatal care.  She is currently monitored for the following issues for this high-risk pregnancy and has History of gestational hypertension; History of cesarean delivery, currently pregnant; Anemia in pregnancy, unspecified trimester; Supervision of low-risk pregnancy, unspecified trimester; Obesity in pregnancy, antepartum; and Hemoglobin C trait ([place]) on their problem list.  Patient reports no complaints.  Contractions: Not present. Vag. Bleeding: None.  Movement: Present. Denies leaking of fluid.   The following portions of the patient's history were reviewed and updated as appropriate: allergies, current medications, past family history, past medical history, past social history, past surgical history and problem list.   Objective:   Vitals:   12/20/18 1004  BP: (!) 107/58  Pulse: 91  Temp: 97.9 F (36.6 C)  Weight: 244 lb 12.8 oz (111 kg)    Fetal Status: Fetal Heart Rate (bpm): 145 Fundal Height: 27 cm Movement: Present     General:  Alert, oriented and cooperative. Patient is in no acute distress.  Skin: Skin is warm and dry. No rash noted.   Cardiovascular: Normal heart rate noted  Respiratory: Normal respiratory effort, no problems with respiration noted  Abdomen: Soft, gravid, appropriate for gestational age.  Pain/Pressure: Present     Pelvic: Cervical exam deferred        Extremities: Normal range of motion.  Edema: None  Mental Status: Normal mood and affect. Normal behavior. Normal judgment and thought content.   Assessment and Plan:  Pregnancy: G4P1021 at [redacted]w[redacted]d 1. History of gestational hypertension BP is ok--has BabyScripts and working well  2. History of cesarean delivery, currently pregnant For VBAC-consent signed today after review of R/B/C.  3. Supervision of low-risk pregnancy, unspecified trimester 28 wk labs and TDaP today Desires BTL--consent  obtained. Wants [place] - Tdap vaccine greater than or equal to 7yo IM  Preterm labor symptoms and general obstetric precautions including but not limited to vaginal bleeding, contractions, leaking of fluid and fetal movement were reviewed in detail with the patient. Please refer to After Visit Summary for other counseling recommendations.   Return in 2 weeks (on 01/03/2019) for virtual.  Future Appointments  Date Time Provider [place]  01/03/2019  8:15 AM Danielle Rankin [place]  01/17/2019  9:30 AM [place] [facility]-US  01/17/2019  9:30 AM WH-MFC [place] 1 WH-MFCUS MFC-US    Donnamae Jude, MD

## 2018-12-21 ENCOUNTER — Other Ambulatory Visit: Payer: Self-pay | Admitting: Family Medicine

## 2018-12-21 DIAGNOSIS — O99019 Anemia complicating pregnancy, unspecified trimester: Secondary | ICD-10-CM

## 2018-12-21 LAB — CBC
Hematocrit: 30.8 % — ABNORMAL LOW (ref 34.0–46.6)
Hemoglobin: 10.3 g/dL — ABNORMAL LOW (ref 11.1–15.9)
MCH: 26 pg — ABNORMAL LOW (ref 26.6–33.0)
MCHC: 33.4 g/dL (ref 31.5–35.7)
MCV: 78 fL — ABNORMAL LOW (ref 79–97)
Platelets: 286 10*3/uL (ref 150–450)
RBC: 3.96 x10E6/uL (ref 3.77–5.28)
RDW: 13.6 % (ref 11.7–15.4)
WBC: 8.7 10*3/uL (ref 3.4–10.8)

## 2018-12-21 LAB — GLUCOSE TOLERANCE, 2 HOURS W/ 1HR
Glucose, 1 hour: 114 mg/dL (ref 65–179)
Glucose, 2 hour: 88 mg/dL (ref 65–152)
Glucose, Fasting: 83 mg/dL (ref 65–91)

## 2018-12-21 LAB — HIV ANTIBODY (ROUTINE TESTING W REFLEX): HIV Screen 4th Generation wRfx: NONREACTIVE

## 2018-12-21 LAB — RPR: RPR Ser Ql: NONREACTIVE

## 2018-12-21 MED ORDER — FERROUS SULFATE 325 (65 FE) MG PO TABS: 325.0000 mg | ORAL_TABLET | Freq: Every day | ORAL | 1 refills | Status: DC

## 2018-12-26 ENCOUNTER — Encounter: Payer: Self-pay | Admitting: *Deleted

## 2019-01-03 ENCOUNTER — Encounter: Payer: Self-pay | Admitting: Medical

## 2019-01-03 ENCOUNTER — Encounter: Payer: Medicaid Other | Admitting: Medical

## 2019-01-03 ENCOUNTER — Other Ambulatory Visit: Payer: Self-pay

## 2019-01-03 NOTE — Patient Instructions (Signed)
Please reschedule your appointment as soon as possible 

## 2019-01-03 NOTE — Progress Notes (Signed)
Called Pt, request to be rescheduled, too early.

## 2019-01-04 ENCOUNTER — Other Ambulatory Visit: Payer: Self-pay

## 2019-01-04 ENCOUNTER — Encounter: Payer: Self-pay | Admitting: Medical

## 2019-01-04 ENCOUNTER — Telehealth (INDEPENDENT_AMBULATORY_CARE_PROVIDER_SITE_OTHER): Payer: Medicaid Other | Admitting: Medical

## 2019-01-04 VITALS — BP 120/73

## 2019-01-04 DIAGNOSIS — O34219 Maternal care for unspecified type scar from previous cesarean delivery: Secondary | ICD-10-CM

## 2019-01-04 DIAGNOSIS — O99210 Obesity complicating pregnancy, unspecified trimester: Secondary | ICD-10-CM

## 2019-01-04 DIAGNOSIS — Z3490 Encounter for supervision of normal pregnancy, unspecified, unspecified trimester: Secondary | ICD-10-CM

## 2019-01-04 DIAGNOSIS — O99213 Obesity complicating pregnancy, third trimester: Secondary | ICD-10-CM

## 2019-01-04 DIAGNOSIS — Z8759 Personal history of other complications of pregnancy, childbirth and the puerperium: Secondary | ICD-10-CM

## 2019-01-04 DIAGNOSIS — Z3A29 29 weeks gestation of pregnancy: Secondary | ICD-10-CM

## 2019-01-04 NOTE — Patient Instructions (Signed)
Fetal Movement Counts Patient Name: ________________________________________________ Patient Due Date: ____________________ What is a fetal movement count?  A fetal movement count is the number of times that you feel your baby move during a certain amount of time. This may also be called a fetal kick count. A fetal movement count is recommended for every pregnant woman. You may be asked to start counting fetal movements as early as week 28 of your pregnancy. Pay attention to when your baby is most active. You may notice your baby's sleep and wake cycles. You may also notice things that make your baby move more. You should do a fetal movement count:  When your baby is normally most active.  At the same time each day. A good time to count movements is while you are resting, after having something to eat and drink. How do I count fetal movements? 1. Find a quiet, comfortable area. Sit, or lie down on your side. 2. Write down the date, the start time and stop time, and the number of movements that you felt between those two times. Take this information with you to your health care visits. 3. For 2 hours, count kicks, flutters, swishes, rolls, and jabs. You should feel at least 10 movements during 2 hours. 4. You may stop counting after you have felt 10 movements. 5. If you do not feel 10 movements in 2 hours, have something to eat and drink. Then, keep resting and counting for 1 hour. If you feel at least 4 movements during that hour, you may stop counting. Contact a health care provider if:  You feel fewer than 4 movements in 2 hours.  Your baby is not moving like he or she usually does. Date: ____________ Start time: ____________ Stop time: ____________ Movements: ____________ Date: ____________ Start time: ____________ Stop time: ____________ Movements: ____________ Date: ____________ Start time: ____________ Stop time: ____________ Movements: ____________ Date: ____________ Start time:  ____________ Stop time: ____________ Movements: ____________ Date: ____________ Start time: ____________ Stop time: ____________ Movements: ____________ Date: ____________ Start time: ____________ Stop time: ____________ Movements: ____________ Date: ____________ Start time: ____________ Stop time: ____________ Movements: ____________ Date: ____________ Start time: ____________ Stop time: ____________ Movements: ____________ Date: ____________ Start time: ____________ Stop time: ____________ Movements: ____________ This information is not intended to replace advice given to you by your health care provider. Make sure you discuss any questions you have with your health care provider. Document Released: 07/07/2006 Document Revised: 06/27/2018 Document Reviewed: 07/17/2015 Elsevier Patient Education  2020 [employer] Braxton Hicks Contractions Contractions of the uterus can occur throughout pregnancy, but they are not always a sign that you are in labor. You may have practice contractions called Braxton Hicks contractions. These false labor contractions are sometimes confused with true labor. What are Braxton Hicks contractions? Braxton Hicks contractions are tightening movements that occur in the muscles of the uterus before labor. Unlike true labor contractions, these contractions do not result in opening (dilation) and thinning of the cervix. Toward the end of pregnancy (32-34 weeks), Braxton Hicks contractions can happen more often and may become stronger. These contractions are sometimes difficult to tell apart from true labor because they can be very uncomfortable. You should not feel embarrassed if you go to the hospital with false labor. Sometimes, the only way to tell if you are in true labor is for your health care provider to look for changes in the cervix. The health care provider will do a physical exam and may monitor your contractions. If you   are not in true labor, the exam should show  that your cervix is not dilating and your water has not broken. If there are no other health problems associated with your pregnancy, it is completely safe for you to be sent home with false labor. You may continue to have Braxton Hicks contractions until you go into true labor. How to tell the difference between true labor and false labor True labor  Contractions last 30-70 seconds.  Contractions become very regular.  Discomfort is usually felt in the top of the uterus, and it spreads to the lower abdomen and low back.  Contractions do not go away with walking.  Contractions usually become more intense and increase in frequency.  The cervix dilates and gets thinner. False labor  Contractions are usually shorter and not as strong as true labor contractions.  Contractions are usually irregular.  Contractions are often felt in the front of the lower abdomen and in the groin.  Contractions may go away when you walk around or change positions while lying down.  Contractions get weaker and are shorter-lasting as time goes on.  The cervix usually does not dilate or become thin. Follow these instructions at home:   Take over-the-counter and prescription medicines only as told by your health care provider.  Keep up with your usual exercises and follow other instructions from your health care provider.  Eat and drink lightly if you think you are going into labor.  If Braxton Hicks contractions are making you uncomfortable: ? Change your position from lying down or resting to walking, or change from walking to resting. ? Sit and rest in a tub of warm water. ? Drink enough fluid to keep your urine pale yellow. Dehydration may cause these contractions. ? Do slow and deep breathing several times an hour.  Keep all follow-up prenatal visits as told by your health care provider. This is important. Contact a health care provider if:  You have a fever.  You have continuous pain in  your abdomen. Get help right away if:  Your contractions become stronger, more regular, and closer together.  You have fluid leaking or gushing from your vagina.  You pass blood-tinged mucus (bloody show).  You have bleeding from your vagina.  You have low back pain that you never had before.  You feel your baby's head pushing down and causing pelvic pressure.  Your baby is not moving inside you as much as it used to. Summary  Contractions that occur before labor are called Braxton Hicks contractions, false labor, or practice contractions.  Braxton Hicks contractions are usually shorter, weaker, farther apart, and less regular than true labor contractions. True labor contractions usually become progressively stronger and regular, and they become more frequent.  Manage discomfort from Braxton Hicks contractions by changing position, resting in a warm bath, drinking plenty of water, or practicing deep breathing. This information is not intended to replace advice given to you by your health care provider. Make sure you discuss any questions you have with your health care provider. Document Released: 10/21/2016 Document Revised: 05/20/2017 Document Reviewed: 10/21/2016 Elsevier Patient Education  2020 Elsevier Inc.  

## 2019-01-04 NOTE — Progress Notes (Signed)
Pt states will take BP later & chart in BRx. Cuff is in Daughters room & she's sleep & she doesn't want to wake her.

## 2019-01-04 NOTE — Progress Notes (Signed)
I connected with Kayla [name] on 01/04/19 at  9:35 AM EDT by: MyChart and verified that I am speaking with the correct person using two identifiers.  Patient is located at home and provider is located at [facility].     The purpose of this virtual visit is to provide medical care while limiting exposure to the novel coronavirus. I discussed the limitations, risks, security and privacy concerns of performing an evaluation and management service by Mychart and the availability of in person appointments. I also discussed with the patient that there may be a patient responsible charge related to this service. By engaging in this virtual visit, you consent to the provision of healthcare.  Additionally, you authorize for your insurance to be billed for the services provided during this visit.  The patient expressed understanding and agreed to proceed.  The following staff members participated in the virtual visit:  Carver Fila, [place] VISIT NOTE  Subjective:  Kayla [name] is a 33 y.o. [id number] at [redacted]w[redacted]d  for phone visit for ongoing prenatal care.  She is currently monitored for the following issues for this low-risk pregnancy and has History of gestational hypertension; History of cesarean delivery, currently pregnant; Anemia in pregnancy, unspecified trimester; Supervision of low-risk pregnancy, unspecified trimester; Obesity in pregnancy, antepartum; and Hemoglobin C trait ([place]) on their problem list.  Patient reports no complaints.  Contractions: Not present. Vag. Bleeding: None.  Movement: Present. Denies leaking of fluid.   The following portions of the patient's history were reviewed and updated as appropriate: allergies, current medications, past family history, past medical history, past social history, past surgical history and problem list.   Objective:   Vitals:   01/04/19 0939  BP: 120/73   Self-Obtained  Fetal Status:     Movement: Present     Assessment and Plan:  Pregnancy: G4P1021  at [redacted]w[redacted]d 1. Supervision of low-risk pregnancy, unspecified trimester - Doing well - Discussed options for birth control again, still leaning towards BTL - TOLAC and BTL consent forms signed at last visit  - Follow-up US scheduled for 7/29 to complete anatomy   Preterm labor symptoms and general obstetric precautions including but not limited to vaginal bleeding, contractions, leaking of fluid and fetal movement were reviewed in detail with the patient.  Return in about 2 weeks (around 01/18/2019) for LOB, Virtual.  Future Appointments  Date Time Provider [place]  01/17/2019  9:30 AM [place] MFC-US  01/17/2019  9:30 AM WH-MFC [place] 1 WH-MFCUS MFC-US     Time spent on virtual visit: 20 minutes  Kerry Hough, PA-C

## 2019-01-17 ENCOUNTER — Other Ambulatory Visit (HOSPITAL_COMMUNITY): Payer: Self-pay | Admitting: *Deleted

## 2019-01-17 ENCOUNTER — Encounter (HOSPITAL_COMMUNITY): Payer: Self-pay

## 2019-01-17 ENCOUNTER — Ambulatory Visit (HOSPITAL_COMMUNITY)
Admission: RE | Admit: 2019-01-17 | Discharge: 2019-01-17 | Disposition: A | Discharge status: Home / Self Care | Payer: Medicaid Other | Source: Ambulatory Visit | Attending: Obstetrics and Gynecology | Admitting: Obstetrics and Gynecology

## 2019-01-17 ENCOUNTER — Other Ambulatory Visit: Payer: Self-pay

## 2019-01-17 ENCOUNTER — Ambulatory Visit (HOSPITAL_COMMUNITY): Payer: Medicaid Other | Admitting: *Deleted

## 2019-01-17 VITALS — BP 123/72 | HR 102 | Temp 98.7°F

## 2019-01-17 DIAGNOSIS — R638 Other symptoms and signs concerning food and fluid intake: Secondary | ICD-10-CM | POA: Diagnosis present

## 2019-01-17 DIAGNOSIS — O99212 Obesity complicating pregnancy, second trimester: Secondary | ICD-10-CM | POA: Diagnosis not present

## 2019-01-17 DIAGNOSIS — O34219 Maternal care for unspecified type scar from previous cesarean delivery: Secondary | ICD-10-CM

## 2019-01-17 DIAGNOSIS — O133 Gestational [pregnancy-induced] hypertension without significant proteinuria, third trimester: Secondary | ICD-10-CM

## 2019-01-17 DIAGNOSIS — Z363 Encounter for antenatal screening for malformations: Secondary | ICD-10-CM

## 2019-01-17 DIAGNOSIS — Z3A31 31 weeks gestation of pregnancy: Secondary | ICD-10-CM

## 2019-01-17 DIAGNOSIS — O99213 Obesity complicating pregnancy, third trimester: Secondary | ICD-10-CM

## 2019-01-18 ENCOUNTER — Telehealth (INDEPENDENT_AMBULATORY_CARE_PROVIDER_SITE_OTHER): Payer: Medicaid Other | Admitting: Obstetrics and Gynecology

## 2019-01-18 DIAGNOSIS — O99213 Obesity complicating pregnancy, third trimester: Secondary | ICD-10-CM

## 2019-01-18 DIAGNOSIS — O99013 Anemia complicating pregnancy, third trimester: Secondary | ICD-10-CM

## 2019-01-18 DIAGNOSIS — O99019 Anemia complicating pregnancy, unspecified trimester: Secondary | ICD-10-CM

## 2019-01-18 DIAGNOSIS — O99210 Obesity complicating pregnancy, unspecified trimester: Secondary | ICD-10-CM

## 2019-01-18 DIAGNOSIS — O26893 Other specified pregnancy related conditions, third trimester: Secondary | ICD-10-CM

## 2019-01-18 DIAGNOSIS — Z8759 Personal history of other complications of pregnancy, childbirth and the puerperium: Secondary | ICD-10-CM

## 2019-01-18 DIAGNOSIS — Z3A31 31 weeks gestation of pregnancy: Secondary | ICD-10-CM

## 2019-01-18 DIAGNOSIS — D582 Other hemoglobinopathies: Secondary | ICD-10-CM | POA: Diagnosis not present

## 2019-01-18 DIAGNOSIS — Z3490 Encounter for supervision of normal pregnancy, unspecified, unspecified trimester: Secondary | ICD-10-CM

## 2019-01-18 DIAGNOSIS — O34219 Maternal care for unspecified type scar from previous cesarean delivery: Secondary | ICD-10-CM

## 2019-01-18 NOTE — Patient Instructions (Signed)
[email or web address] regarding doula

## 2019-01-18 NOTE — Progress Notes (Signed)
   TELEHEALTH VIRTUAL OBSTETRICS VISIT ENCOUNTER NOTE  I connected with Marisue Ivan on 01/18/19 at  2:15 PM EDT by telephone at home and verified that I am speaking with the correct person using two identifiers.   I discussed the limitations, risks, security and privacy concerns of performing an evaluation and management service by telephone and the availability of in person appointments. I also discussed with the patient that there may be a patient responsible charge related to this service. The patient expressed understanding and agreed to proceed.  Subjective:  Kayla [name] is a 33 y.o. G4P1021 at 77w3dbeing followed for ongoing prenatal care.  She is currently monitored for the following issues for this low-risk pregnancy and has History of gestational hypertension; History of cesarean delivery, currently pregnant; Anemia in pregnancy, unspecified trimester; Supervision of low-risk pregnancy, unspecified trimester; Obesity in pregnancy, antepartum; and Hemoglobin C trait ([place] on their problem list.  Patient reports no complaints. Reports fetal movement. Denies any contractions, bleeding or leaking of fluid.   The following portions of the patient's history were reviewed and updated as appropriate: allergies, current medications, past family history, past medical history, past social history, past surgical history and problem list.   Objective:   General:  Alert, oriented and cooperative.   Mental Status: Normal mood and affect perceived. Normal judgment and thought content.  Rest of physical exam deferred due to type of encounter  Assessment and Plan:  Pregnancy: G4P1021 at 361w3d. Anemia in pregnancy, unspecified trimester  Continue iron   2. Supervision of low-risk pregnancy, unspecified trimester  BP 123/72  3. Obesity in pregnancy, antepartum  Continue BASA   4. Hemoglobin C trait ([place]  5. History of cesarean delivery, currently pregnant  TOLAC consent signed  Has  met with Dr. PrKennon Rounds  6. History of gestational hypertension  BP good Continue BASA   Preterm labor symptoms and general obstetric precautions including but not limited to vaginal bleeding, contractions, leaking of fluid and fetal movement were reviewed in detail with the patient.  I discussed the assessment and treatment plan with the patient. The patient was provided an opportunity to ask questions and all were answered. The patient agreed with the plan and demonstrated an understanding of the instructions. The patient was advised to call back or seek an in-person office evaluation/[facility] concerning [facility]. Please refer to After Visit Summary for other counseling recommendations.   I provided 12 minutes of non-face-to-face time during this encounter.  Return in about 2 weeks (around 02/01/2019) for For virtual .  Future Appointments  Date Time Provider [place]9/07/2018 10:00 AM [place]FC-US  02/21/2019 10:00 AM WH-MFC [place] [facility]

## 2019-02-01 ENCOUNTER — Other Ambulatory Visit: Payer: Self-pay

## 2019-02-01 ENCOUNTER — Telehealth: Payer: Self-pay | Admitting: Obstetrics and Gynecology

## 2019-02-01 ENCOUNTER — Telehealth (INDEPENDENT_AMBULATORY_CARE_PROVIDER_SITE_OTHER): Payer: Medicaid Other | Admitting: Obstetrics and Gynecology

## 2019-02-01 DIAGNOSIS — O99013 Anemia complicating pregnancy, third trimester: Secondary | ICD-10-CM

## 2019-02-01 DIAGNOSIS — O99210 Obesity complicating pregnancy, unspecified trimester: Secondary | ICD-10-CM

## 2019-02-01 DIAGNOSIS — Z3A33 33 weeks gestation of pregnancy: Secondary | ICD-10-CM

## 2019-02-01 DIAGNOSIS — Z3490 Encounter for supervision of normal pregnancy, unspecified, unspecified trimester: Secondary | ICD-10-CM

## 2019-02-01 DIAGNOSIS — O34219 Maternal care for unspecified type scar from previous cesarean delivery: Secondary | ICD-10-CM | POA: Diagnosis not present

## 2019-02-01 DIAGNOSIS — O99213 Obesity complicating pregnancy, third trimester: Secondary | ICD-10-CM

## 2019-02-01 DIAGNOSIS — Z8759 Personal history of other complications of pregnancy, childbirth and the puerperium: Secondary | ICD-10-CM

## 2019-02-01 DIAGNOSIS — O99019 Anemia complicating pregnancy, unspecified trimester: Secondary | ICD-10-CM

## 2019-02-01 NOTE — Progress Notes (Deleted)
   PRENATAL VISIT NOTE  Subjective:  Kayla [name] is a 33 y.o. G4P1021 at [redacted]w[redacted]d being seen today for ongoing prenatal care.  She is currently monitored for the following issues for this {Blank single:19197::"high-risk","low-risk"} pregnancy and has History of gestational hypertension; History of cesarean delivery, currently pregnant; Anemia in pregnancy, unspecified trimester; Supervision of low-risk pregnancy, unspecified trimester; Obesity in pregnancy, antepartum; and Hemoglobin C trait ([place]) on their problem list.  Patient reports {sx:14538}.  Contractions: Not present. Vag. Bleeding: None.  Movement: Present. Denies leaking of fluid.   The following portions of the patient's history were reviewed and updated as appropriate: allergies, current medications, past family history, past medical history, past social history, past surgical history and problem list.   Objective:   Vitals:   02/01/19 1633  BP: 113/69    Fetal Status:     Movement: Present     General:  Alert, oriented and cooperative. Patient is in no acute distress.  Skin: Skin is warm and dry. No rash noted.   Cardiovascular: Normal heart rate noted  Respiratory: Normal respiratory effort, no problems with respiration noted  Abdomen: Soft, gravid, appropriate for gestational age.  Pain/Pressure: Absent     Pelvic: {Blank single:19197::"Cervical exam performed","Cervical exam deferred"}        Extremities: Normal range of motion.  Edema: None  Mental Status: Normal mood and affect. Normal behavior. Normal judgment and thought content.   Assessment and Plan:  Pregnancy: G4P1021 at [redacted]w[redacted]d  1. Anemia in pregnancy, unspecified trimester ***  2. Supervision of low-risk pregnancy, unspecified trimester ***  3. Obesity in pregnancy, antepartum ***  4. History of cesarean delivery, currently pregnant ***  5. History of gestational hypertension ***  {Blank single:19197::"Term","Preterm"} labor symptoms and general  obstetric precautions including but not limited to vaginal bleeding, contractions, leaking of fluid and fetal movement were reviewed in detail with the patient. Please refer to After Visit Summary for other counseling recommendations.   No follow-ups on file.  Future Appointments  Date Time Provider [place]  02/21/2019 10:00 AM [place] MFC-US  02/21/2019 10:00 AM [place] 3 WH-MFCUS MFC-US    Noni Saupe, NP

## 2019-02-01 NOTE — Telephone Encounter (Signed)
Attempted to call patient with her next OB appointment. No answer, after a couple times of ringing the number rang busy. Reminder mailed

## 2019-02-01 NOTE — Progress Notes (Signed)
I connected with  Kayla [name] on 02/01/19 at  4:15 PM EDT by telephone and verified that I am speaking with the correct person using two identifiers.   I discussed the limitations, risks, security and privacy concerns of performing an evaluation and management service by telephone and the availability of in person appointments. I also discussed with the patient that there may be a patient responsible charge related to this service. The patient expressed understanding and agreed to proceed.  Alric Seton, [place] 02/01/2019  4:34 PM

## 2019-02-01 NOTE — Progress Notes (Signed)
   TELEHEALTH VIRTUAL OBSTETRICS VISIT ENCOUNTER NOTE  I connected with Kayla [name] on 02/01/19 at  4:15 PM EDT by telephone at home and verified that I am speaking with the correct person using two identifiers.   I discussed the limitations, risks, security and privacy concerns of performing an evaluation and management service by telephone and the availability of in person appointments. I also discussed with the patient that there may be a patient responsible charge related to this service. The patient expressed understanding and agreed to proceed.  Subjective:  Kayla [name] is a 33 y.o. G4P1021 at [redacted]w[redacted]d being followed for ongoing prenatal care.  She is currently monitored for the following issues for this low-risk pregnancy and has History of gestational hypertension; History of cesarean delivery, currently pregnant; Anemia in pregnancy, unspecified trimester; Supervision of low-risk pregnancy, unspecified trimester; Obesity in pregnancy, antepartum; and Hemoglobin C trait ([place]) on their problem list.  Patient reports no complaints. Reports fetal movement. Denies any contractions, bleeding or leaking of fluid.   The following portions of the patient's history were reviewed and updated as appropriate: allergies, current medications, past family history, past medical history, past social history, past surgical history and problem list.   Objective:   General:  Alert, oriented and cooperative.   Mental Status: Normal mood and affect perceived. Normal judgment and thought content.  Rest of physical exam deferred due to type of encounter  Blood pressure 113/69, last menstrual period 06/12/2018.  Assessment and Plan:  Pregnancy: G4P1021 at [redacted]w[redacted]d  1. Anemia in pregnancy, unspecified trimester  Not taking Iron: eating pumpkin seeds   2. Supervision of low-risk pregnancy, unspecified trimester  Doing well   3. Obesity in pregnancy, antepartum  Not taking BASA.   4. History of cesarean  delivery, currently pregnant  TOLAC-consent signed   5. History of gestational hypertension  BP 113/69 Not taking BASA.  Preterm labor symptoms and general obstetric precautions including but not limited to vaginal bleeding, contractions, leaking of fluid and fetal movement were reviewed in detail with the patient.  I discussed the assessment and treatment plan with the patient. The patient was provided an opportunity to ask questions and all were answered. The patient agreed with the plan and demonstrated an understanding of the instructions. The patient was advised to call back or seek an in-person office evaluation/[facility] to [facility]U at [facility] for any urgent or concerning symptoms. Please refer to After Visit Summary for other counseling recommendations.   I provided 11 minutes of non-face-to-face time during this encounter.  Return in about 3 weeks (around 02/22/2019), or Please put her on my schedule. She needs GBS.  Future Appointments  Date Time Provider [place]  02/21/2019 10:00 AM [place] MFC-US  02/21/2019 10:00 AM WH-MFC [place] 3 WH-MFCUS MFC-US     , NP Center for [employer], [place]

## 2019-02-15 ENCOUNTER — Encounter: Payer: Self-pay | Admitting: *Deleted

## 2019-02-21 ENCOUNTER — Ambulatory Visit (HOSPITAL_COMMUNITY): Payer: Medicaid Other

## 2019-02-21 ENCOUNTER — Telehealth: Payer: Self-pay | Admitting: Obstetrics and Gynecology

## 2019-02-21 NOTE — Telephone Encounter (Signed)
Attempted to call patient about her appointment on 9/3 @ 10:15. No answer left voicemail instructing patient to wear a face mask for the entire appointment and no visitors are allowed during the visit. Patient instructed not to attend the appointment if she was any symptoms. Symptom list and office number left.

## 2019-02-22 ENCOUNTER — Ambulatory Visit (HOSPITAL_COMMUNITY): Payer: Medicaid Other | Admitting: *Deleted

## 2019-02-22 ENCOUNTER — Encounter (HOSPITAL_COMMUNITY): Payer: Self-pay

## 2019-02-22 ENCOUNTER — Ambulatory Visit (INDEPENDENT_AMBULATORY_CARE_PROVIDER_SITE_OTHER): Payer: Medicaid Other | Admitting: Obstetrics and Gynecology

## 2019-02-22 ENCOUNTER — Ambulatory Visit (HOSPITAL_COMMUNITY)
Admission: RE | Admit: 2019-02-22 | Discharge: 2019-02-22 | Disposition: A | Discharge status: Home / Self Care | Payer: Medicaid Other | Source: Ambulatory Visit | Attending: Obstetrics and Gynecology | Admitting: Obstetrics and Gynecology

## 2019-02-22 ENCOUNTER — Other Ambulatory Visit: Payer: Self-pay

## 2019-02-22 ENCOUNTER — Other Ambulatory Visit (HOSPITAL_COMMUNITY)
Admission: RE | Admit: 2019-02-22 | Discharge: 2019-02-22 | Disposition: A | Discharge status: Home / Self Care | Payer: Medicaid Other | Source: Ambulatory Visit | Attending: Obstetrics and Gynecology | Admitting: Obstetrics and Gynecology

## 2019-02-22 VITALS — BP 120/72 | HR 108 | Wt 245.0 lb

## 2019-02-22 DIAGNOSIS — O99213 Obesity complicating pregnancy, third trimester: Secondary | ICD-10-CM | POA: Insufficient documentation

## 2019-02-22 DIAGNOSIS — Z3493 Encounter for supervision of normal pregnancy, unspecified, third trimester: Secondary | ICD-10-CM

## 2019-02-22 DIAGNOSIS — O34219 Maternal care for unspecified type scar from previous cesarean delivery: Secondary | ICD-10-CM | POA: Diagnosis not present

## 2019-02-22 DIAGNOSIS — O99210 Obesity complicating pregnancy, unspecified trimester: Secondary | ICD-10-CM | POA: Diagnosis present

## 2019-02-22 DIAGNOSIS — O99019 Anemia complicating pregnancy, unspecified trimester: Secondary | ICD-10-CM | POA: Diagnosis present

## 2019-02-22 DIAGNOSIS — Z8759 Personal history of other complications of pregnancy, childbirth and the puerperium: Secondary | ICD-10-CM | POA: Diagnosis present

## 2019-02-22 DIAGNOSIS — Z3490 Encounter for supervision of normal pregnancy, unspecified, unspecified trimester: Secondary | ICD-10-CM | POA: Diagnosis present

## 2019-02-22 DIAGNOSIS — D582 Other hemoglobinopathies: Secondary | ICD-10-CM

## 2019-02-22 DIAGNOSIS — Z362 Encounter for other antenatal screening follow-up: Secondary | ICD-10-CM | POA: Diagnosis not present

## 2019-02-22 DIAGNOSIS — Z3A36 36 weeks gestation of pregnancy: Secondary | ICD-10-CM

## 2019-02-22 MED ORDER — FAMOTIDINE 20 MG PO TABS: 20.0000 mg | ORAL_TABLET | Freq: Two times a day (BID) | ORAL | 2 refills | Status: DC

## 2019-02-22 NOTE — Progress Notes (Signed)
   PRENATAL VISIT NOTE  Subjective:  Kayla [name] is a 33 y.o. G4P1021 at [redacted]w[redacted]d being seen today for ongoing prenatal care.  She is currently monitored for the following issues for this low-risk pregnancy and has History of gestational hypertension; History of cesarean delivery, currently pregnant; Anemia in pregnancy, unspecified trimester; Supervision of low-risk pregnancy, unspecified trimester; Obesity in pregnancy, antepartum; and Hemoglobin C trait ([place]) on their problem list.  Patient reports no complaints.  Contractions: Not present. Vag. Bleeding: None.  Movement: Present. Denies leaking of fluid.   The following portions of the patient's history were reviewed and updated as appropriate: allergies, current medications, past family history, past medical history, past social history, past surgical history and problem list.   Objective:   Vitals:   02/22/19 1038  BP: 120/72  Pulse: (!) 108  Weight: 245 lb (111.1 kg)    Fetal Status: Fetal Heart Rate (bpm): 154   Movement: Present     General:  Alert, oriented and cooperative. Patient is in no acute distress.  Skin: Skin is warm and dry. No rash noted.   Cardiovascular: Normal heart rate noted  Respiratory: Normal respiratory effort, no problems with respiration noted  Abdomen: Soft, gravid, appropriate for gestational age.  Pain/Pressure: Present     Pelvic: Cervical exam performed       0.5cm, anterior, soft.   Extremities: Normal range of motion.  Edema: None  Mental Status: Normal mood and affect. Normal behavior. Normal judgment and thought content.   Assessment and Plan:  Pregnancy: G4P1021 at [redacted]w[redacted]d  1. Supervision of low-risk pregnancy, unspecified trimester  - GC/Chlamydia Probe Amp - Culture, beta strep (group b only) - Patient requesting in-person visits     Preterm labor symptoms and general obstetric precautions including but not limited to vaginal bleeding, contractions, leaking of fluid and fetal movement  were reviewed in detail with the patient. Please refer to After Visit Summary for other counseling recommendations.   Return in about 1 week (around 03/01/2019), or In-Person with me please.  Future Appointments  Date Time Provider [place]  02/27/2019  9:15 AM Lavonia Drafts, MD [facility] [place]  03/08/2019 11:15 AM Myisha Pickerel, Artist Pais, NP [facility] WOC  03/15/2019 10:15 AM Giles Currie, Artist Pais, NP WOC-WOCA WOC  03/21/2019  1:15 PM WOC-WOCA NST WOC-WOCA WOC  03/21/2019  2:15 PM Luvenia Redden, PA-C WOC-WOCA WOC    Noni Saupe, NP

## 2019-02-22 NOTE — Patient Instructions (Addendum)
CIRCUMCISION  Circumcision is considered an elective/non-medically necessary procedure. There are many reasons parents decide to have their sons circumsized. During the first year of life circumcised males have a reduced risk of urinary tract infections but after this year the rates between circumcised males and uncircumcised males are the same.  It is safe to have your son circumcised outside of the hospital and the places above perform them regularly.    Places to have your son circumcised:    [facility] [phone] $480 by 4 wks  [facility] [phone] $244 by 4 wks  [facility] [phone] $175 by 2 wks  [facility] [phone] $250 by 7 days [facility] [phone] $150 by 4 wks  These prices sometimes change but are roughly what you can expect to pay. Please call and confirm pricing.      The MilesCircuit  This circuit takes at least 90 minutes to complete so clear your schedule and make mental preparations so you can relax in your environment. The second step requires a lot of pillows so gather them up before beginning Before starting, you should empty your bladder! Have a nice drink nearby, and make sure it has a straw! If you are having contractions, this circuit should be done through contractions, try not to change positions between steps Before you begin...  "I named this 'circuit' after my friend Deneen HartsMegan Miles, who shared and discussed it with me when I was working with a client whose labor seemed to be stalled out and no longer progressing... This circuit is useful to help get the baby lined up, ideally, in the "Left Occiput Anterior" (LOA) Position, both before labor begins and when some corrections need to be done during labor. Prenatally, this position set can help to rotate a baby. As a natural method of  induction, this can help get things going if baby just needed a gentle nudge of position to set things off. To the best of my knowledge, this group of positions will not "hurt" a baby that is already lined up correctly." - Sharlyn BolognaSharon Muza   Step One: Open-knee Chest Stay in this position for 30 minutes, start in cat/cow, then drop your chest as low as you can to the bed or the floor and your bottom as high as you can. Knees should be fairly wide apart, and the angle between the torso/thighs should be wider than 90 degrees. Wiggle around, prop with lots of pillows and use this time to get totally relaxed. This position allows the baby to scoot out of the pelvis a bit and gives them room to rotate, shift their head position, etc. If the pregnant person finds it helpful, careful positioning with a rebozo under the belly, with gentle tension from a support person behind can help maintain this position for the full 30 minutes.  Step NFA:OZHYQMVHQIOTwo:Exaggerated Left Side Lying Roll to your left side, bringing your top leg as high as possible and keeping your bottom leg straight. Roll forward as much as possible, again using a lot of pillows. Sink into the bed and relax some more. If you fall asleep, that's totally okay and you can stay there! If not, stay here for at least another half an hour. Try and get your top right leg up towards your head and get as rolled over onto your belly as much as possible. If you repeat the circuit during labor, try alternating left and right sides. We know the photo the left is actually right side... just flip the image in  your head.  Step Three: Moving and Lunges Lunge, walk stairs facing sideways, 2 at a time, (have a spotter downstairs of you!), take a walk outside with one foot on the curb and the other on the street, sit on a birth ball and hula- anything that's upright and putting your pelvis in open, asymmetrical positions. Spend at least 30 minutes doing  this one as well to give your baby a chance to move down. If you are lunging or stair or curb walking, you should lunge/walk/go up stairs in the direction that feels better to you. The key with the lunge is that the toes of the higher leg and mom's belly button should be at right angles. Do not lunge over your knee, that closes the pelvis.     Lockhart Creator - [email or web address] Greggory Stallion, CD, BDT (DONA), LCCE, FACCE: Supporting Content - [email or web address] Jon Gills: Photography - [email or web address] Trina Ao CD/CDT [facility]): Print and Webmaster - [email or web address] MilesCircuit Master[email or web address]

## 2019-02-23 ENCOUNTER — Telehealth: Payer: Self-pay | Admitting: Obstetrics & Gynecology

## 2019-02-23 LAB — CERVICOVAGINAL ANCILLARY ONLY
Chlamydia: NEGATIVE
Neisseria Gonorrhea: NEGATIVE

## 2019-02-23 NOTE — Telephone Encounter (Signed)
Attempted to contact patient about her appointment on 9/8 @ 9:15. Patient instructed that the appointment is a mychart visit. Patient instructed to download the mychart app if not already done so. Patient instructed to give the office a call with any concerns. °

## 2019-02-26 LAB — CULTURE, BETA STREP (GROUP B ONLY): Strep Gp B Culture: NEGATIVE

## 2019-02-27 ENCOUNTER — Telehealth: Payer: Self-pay | Admitting: Obstetrics & Gynecology

## 2019-02-27 ENCOUNTER — Encounter: Payer: Medicaid Other | Admitting: Obstetrics & Gynecology

## 2019-02-27 ENCOUNTER — Encounter: Payer: Medicaid Other | Admitting: Student

## 2019-02-27 ENCOUNTER — Encounter: Payer: Self-pay | Admitting: Obstetrics & Gynecology

## 2019-02-27 ENCOUNTER — Other Ambulatory Visit: Payer: Self-pay

## 2019-02-27 NOTE — Progress Notes (Signed)
Called pt @ 0915 and LM that I am calling in regards to her appt @ 0915 if she could please call the office back.  I will call back in 15 min in which if I do not reach you I will have to request that she reschedules her appt.   Called pt @ 0930 went straight to VM. LM that this is my second attempt in trying to reach you that I will have to request that she calls the office to reschedule her appt.    Mel Almond, RN

## 2019-02-27 NOTE — Telephone Encounter (Signed)
Attempted to call patient about her missed appointment on 9/8. No answer, left voicemail instructing patient to give the office a call back to be rescheduled. No show letter mailed.  °

## 2019-03-07 ENCOUNTER — Telehealth: Payer: Self-pay | Admitting: Obstetrics & Gynecology

## 2019-03-07 NOTE — Telephone Encounter (Signed)
Called the patient to inform of the upcoming appointment. Left a detailed voicemail inform the patient if she has been diagnosed with covid, in close contact with someone who has had covid, or experienced any flu-like symptoms in the past 14 days please give our office a call to reschedule. Also notified due to Covid19 restriction no children or visitors are allowed. °

## 2019-03-08 ENCOUNTER — Ambulatory Visit (INDEPENDENT_AMBULATORY_CARE_PROVIDER_SITE_OTHER): Payer: Medicaid Other | Admitting: Obstetrics and Gynecology

## 2019-03-08 ENCOUNTER — Other Ambulatory Visit: Payer: Self-pay

## 2019-03-08 VITALS — BP 124/68 | HR 102 | Temp 98.0°F | Wt 247.6 lb

## 2019-03-08 DIAGNOSIS — Z3490 Encounter for supervision of normal pregnancy, unspecified, unspecified trimester: Secondary | ICD-10-CM

## 2019-03-08 DIAGNOSIS — Z8759 Personal history of other complications of pregnancy, childbirth and the puerperium: Secondary | ICD-10-CM

## 2019-03-08 DIAGNOSIS — Z3A38 38 weeks gestation of pregnancy: Secondary | ICD-10-CM

## 2019-03-08 DIAGNOSIS — Z3493 Encounter for supervision of normal pregnancy, unspecified, third trimester: Secondary | ICD-10-CM

## 2019-03-08 NOTE — Progress Notes (Signed)
   PRENATAL VISIT NOTE  Subjective:  Kayla [name] is a 33 y.o. G4P1021 at [redacted]w[redacted]d being seen today for ongoing prenatal care.  She is currently monitored for the following issues for this low-risk pregnancy and has History of gestational hypertension; History of cesarean delivery, currently pregnant; Anemia in pregnancy, unspecified trimester; Supervision of low-risk pregnancy, unspecified trimester; Obesity in pregnancy, antepartum; and Hemoglobin C trait ([place]) on their problem list.  Patient reports no complaints.  Contractions: Not present. Vag. Bleeding: None.  Movement: Present. Denies leaking of fluid.   The following portions of the patient's history were reviewed and updated as appropriate: allergies, current medications, past family history, past medical history, past social history, past surgical history and problem list.   Objective:   Vitals:   03/08/19 1131  BP: 124/68  Pulse: (!) 102  Temp: 98 F (36.7 C)  Weight: 247 lb 9.6 oz (112.3 kg)    Fetal Status: Fetal Heart Rate (bpm): 151   Movement: Present     General:  Alert, oriented and cooperative. Patient is in no acute distress.  Skin: Skin is warm and dry. No rash noted.   Cardiovascular: Normal heart rate noted  Respiratory: Normal respiratory effort, no problems with respiration noted  Abdomen: Soft, gravid, appropriate for gestational age.  Pain/Pressure: Present     Pelvic: Cervical exam performed        Extremities: Normal range of motion.  Edema: None  Mental Status: Normal mood and affect. Normal behavior. Normal judgment and thought content.   Assessment and Plan:  Pregnancy: G4P1021 at [redacted]w[redacted]d  1. Supervision of low-risk pregnancy, unspecified trimester  GBS negative  Discussed cervical ripening options: the miles circuit, primrose oil   2. History of gestational hypertension  BP good.  Not taking ASA    There are no diagnoses linked to this encounter. Term labor symptoms and general obstetric  precautions including but not limited to vaginal bleeding, contractions, leaking of fluid and fetal movement were reviewed in detail with the patient. Please refer to After Visit Summary for other counseling recommendations.   No follow-ups on file.  Future Appointments  Date Time Provider [place]  03/15/2019 10:15 AM Kayla [name], Kayla Pais, NP WOC-WOCA WOC  03/21/2019  1:15 PM WOC-WOCA NST WOC-WOCA WOC  03/21/2019  2:15 PM Luvenia Redden, PA-C WOC-WOCA WOC    Noni Saupe, NP

## 2019-03-14 ENCOUNTER — Telehealth: Payer: Self-pay | Admitting: Obstetrics and Gynecology

## 2019-03-14 NOTE — Telephone Encounter (Signed)
Attempted to call patient about her appointment on 9/24 @ 10:15. No answer left voicemail instructing patient to wear a face mask for the entire appointment and no visitors are allowed during the visit. Patient instructed not to attend the appointment if she was any symptoms. Symptom list and office number left.  °

## 2019-03-15 ENCOUNTER — Telehealth (HOSPITAL_COMMUNITY): Payer: Self-pay | Admitting: *Deleted

## 2019-03-15 ENCOUNTER — Other Ambulatory Visit: Payer: Self-pay

## 2019-03-15 ENCOUNTER — Telehealth: Payer: Self-pay

## 2019-03-15 ENCOUNTER — Encounter (HOSPITAL_COMMUNITY): Payer: Self-pay | Admitting: *Deleted

## 2019-03-15 ENCOUNTER — Ambulatory Visit (INDEPENDENT_AMBULATORY_CARE_PROVIDER_SITE_OTHER): Payer: Medicaid Other | Admitting: Obstetrics and Gynecology

## 2019-03-15 VITALS — BP 121/72 | HR 94 | Temp 98.3°F | Wt 248.3 lb

## 2019-03-15 DIAGNOSIS — Z3009 Encounter for other general counseling and advice on contraception: Secondary | ICD-10-CM

## 2019-03-15 DIAGNOSIS — Z3493 Encounter for supervision of normal pregnancy, unspecified, third trimester: Secondary | ICD-10-CM

## 2019-03-15 DIAGNOSIS — Z8759 Personal history of other complications of pregnancy, childbirth and the puerperium: Secondary | ICD-10-CM

## 2019-03-15 DIAGNOSIS — Z3A39 39 weeks gestation of pregnancy: Secondary | ICD-10-CM

## 2019-03-15 DIAGNOSIS — Z3490 Encounter for supervision of normal pregnancy, unspecified, unspecified trimester: Secondary | ICD-10-CM

## 2019-03-15 DIAGNOSIS — O34219 Maternal care for unspecified type scar from previous cesarean delivery: Secondary | ICD-10-CM

## 2019-03-15 NOTE — Telephone Encounter (Signed)
Preadmission screen  

## 2019-03-15 NOTE — Patient Instructions (Signed)

## 2019-03-15 NOTE — Telephone Encounter (Signed)
Called pt to give corrected IOL date of 03/22/19 @ midnight, arrive 03/21/19 @ 11:45p,Pt  Verbalized understanding.

## 2019-03-15 NOTE — Progress Notes (Signed)
IOL Scheduled on 03/22/2019@ midnight, to arrive 09/30 @ 11:45p.

## 2019-03-15 NOTE — Progress Notes (Signed)
   PRENATAL VISIT NOTE  Subjective:  Kayla [name] is a 33 y.o. G4P1021 at 102w3d being seen today for ongoing prenatal care.  She is currently monitored for the following issues for this low-risk pregnancy and has History of gestational hypertension; History of cesarean delivery, currently pregnant; Anemia in pregnancy, unspecified trimester; Supervision of low-risk pregnancy, unspecified trimester; Obesity in pregnancy, antepartum; and Hemoglobin C trait ([place]) on their problem list.  Patient reports no complaints.  Contractions: Irritability. Vag. Bleeding: None.  Movement: Present. Denies leaking of fluid.   The following portions of the patient's history were reviewed and updated as appropriate: allergies, current medications, past family history, past medical history, past social history, past surgical history and problem list.   Objective:   Vitals:   03/15/19 1058  BP: 121/72  Pulse: 94  Temp: 98.3 F (36.8 C)  Weight: 248 lb 4.8 oz (112.6 kg)    Fetal Status: Fetal Heart Rate (bpm): 138   Movement: Present     General:  Alert, oriented and cooperative. Patient is in no acute distress.  Skin: Skin is warm and dry. No rash noted.   Cardiovascular: Normal heart rate noted  Respiratory: Normal respiratory effort, no problems with respiration noted  Abdomen: Soft, gravid, appropriate for gestational age.  Pain/Pressure: Present     Pelvic: Cervix, FT, 50%, Anterior   Extremities: Normal range of motion.  Edema: None  Mental Status: Normal mood and affect. Normal behavior. Normal judgment and thought content.   Assessment and Plan:  Pregnancy: G4P1021 at [redacted]w[redacted]d  1. Supervision of low-risk pregnancy, unspecified trimester  Induction scheduled for 9/30 @ 2345 Keep office visit for NST and foley insertion On 9/30  2. History of gestational hypertension  BP good today.   3. History of cesarean delivery, currently pregnant  Planning TOLAC, consent signed with Dr. Kennon Rounds      Term labor symptoms and general obstetric precautions including but not limited to vaginal bleeding, contractions, leaking of fluid and fetal movement were reviewed in detail with the patient. Please refer to After Visit Summary for other counseling recommendations.   No follow-ups on file.  Future Appointments  Date Time Provider [place]  03/21/2019  1:15 PM WOC-WOCA NST WOC-WOCA WOC  03/21/2019  2:15 PM Luvenia Redden, PA-C [facility] WOC    Noni Saupe, NP

## 2019-03-15 NOTE — Telephone Encounter (Signed)
Called pt to advise of IOL TIME OF 03/21/2019@ Midnight, no answer, left VM.

## 2019-03-16 ENCOUNTER — Other Ambulatory Visit: Payer: Self-pay | Admitting: Advanced Practice Midwife

## 2019-03-20 ENCOUNTER — Other Ambulatory Visit: Payer: Self-pay

## 2019-03-20 ENCOUNTER — Other Ambulatory Visit (HOSPITAL_COMMUNITY)
Admission: RE | Admit: 2019-03-20 | Discharge: 2019-03-20 | Disposition: A | Discharge status: Home / Self Care | Payer: Medicaid Other | Source: Ambulatory Visit | Attending: Obstetrics & Gynecology | Admitting: Obstetrics & Gynecology

## 2019-03-20 DIAGNOSIS — Z01812 Encounter for preprocedural laboratory examination: Secondary | ICD-10-CM | POA: Insufficient documentation

## 2019-03-20 DIAGNOSIS — Z20828 Contact with and (suspected) exposure to other viral communicable diseases: Secondary | ICD-10-CM | POA: Diagnosis not present

## 2019-03-20 LAB — SARS CORONAVIRUS 2 (TAT 6-24 HRS): SARS Coronavirus 2: NEGATIVE

## 2019-03-20 NOTE — MAU Note (Signed)
Asymptomatic, swab collected. 

## 2019-03-21 ENCOUNTER — Encounter: Payer: Self-pay | Admitting: Obstetrics and Gynecology

## 2019-03-21 ENCOUNTER — Inpatient Hospital Stay (EMERGENCY_DEPARTMENT_HOSPITAL)
Admission: AD | Admit: 2019-03-21 | Discharge: 2019-03-22 | Disposition: A | Discharge status: Home / Self Care | Payer: Medicaid Other | Source: Home / Self Care | Attending: Obstetrics and Gynecology | Admitting: Obstetrics and Gynecology

## 2019-03-21 ENCOUNTER — Other Ambulatory Visit: Payer: Self-pay

## 2019-03-21 ENCOUNTER — Ambulatory Visit (INDEPENDENT_AMBULATORY_CARE_PROVIDER_SITE_OTHER): Payer: Medicaid Other | Admitting: Obstetrics and Gynecology

## 2019-03-21 ENCOUNTER — Ambulatory Visit (INDEPENDENT_AMBULATORY_CARE_PROVIDER_SITE_OTHER): Payer: Medicaid Other | Admitting: *Deleted

## 2019-03-21 VITALS — BP 111/62 | HR 101 | Wt 246.3 lb

## 2019-03-21 DIAGNOSIS — O480 Post-term pregnancy: Secondary | ICD-10-CM

## 2019-03-21 DIAGNOSIS — O09293 Supervision of pregnancy with other poor reproductive or obstetric history, third trimester: Secondary | ICD-10-CM

## 2019-03-21 DIAGNOSIS — Z3490 Encounter for supervision of normal pregnancy, unspecified, unspecified trimester: Secondary | ICD-10-CM

## 2019-03-21 DIAGNOSIS — Z8759 Personal history of other complications of pregnancy, childbirth and the puerperium: Secondary | ICD-10-CM

## 2019-03-21 DIAGNOSIS — Z3A40 40 weeks gestation of pregnancy: Secondary | ICD-10-CM

## 2019-03-21 DIAGNOSIS — O34219 Maternal care for unspecified type scar from previous cesarean delivery: Secondary | ICD-10-CM

## 2019-03-21 NOTE — Progress Notes (Signed)
Vertex presentation confirmed by bedside U/S. 

## 2019-03-21 NOTE — Progress Notes (Signed)
   PRENATAL VISIT NOTE  Subjective:  Kayla [name] is a 33 y.o. G4P1021 at [redacted]w[redacted]d being seen today for ongoing prenatal care.  She is currently monitored for the following issues for this low-risk pregnancy and has History of gestational hypertension; History of cesarean delivery, currently pregnant; Anemia in pregnancy, unspecified trimester; Supervision of low-risk pregnancy, unspecified trimester; Obesity in pregnancy, antepartum; and Hemoglobin C trait ([place]) on their problem list.  Patient reports general discomforts of pregnancy.  Contractions: Irregular. Vag. Bleeding: None, Scant.  Movement: Present. Denies leaking of fluid.   The following portions of the patient's history were reviewed and updated as appropriate: allergies, current medications, past family history, past medical history, past social history, past surgical history and problem list. Problem list updated.  Objective:   Vitals:   03/21/19 1326  BP: 111/62  Pulse: (!) 101  Weight: 246 lb 4.8 oz (111.7 kg)    Fetal Status: Fetal Heart Rate (bpm): NST   Movement: Present     General:  Alert, oriented and cooperative. Patient is in no acute distress.  Skin: Skin is warm and dry. No rash noted.   Cardiovascular: Normal heart rate noted  Respiratory: Normal respiratory effort, no problems with respiration noted  Abdomen: Soft, gravid, appropriate for gestational age.  Pain/Pressure: Present     Pelvic: Cervical exam performed        Extremities: Normal range of motion.  Edema: None  Mental Status:  Normal mood and affect. Normal behavior. Normal judgment and thought content.  Procedure: Patient informed of R/B/A of procedure. NST was performed and was reactive prior to procedure. NST:  EFM: 130's, + accels, no decels, no ut ctx Toco: none Procedure done to begin ripening of the cervix prior to admission for induction of labor. Appropriate time out taken. The patient was placed in the lithotomy position and the cervix  brought into view with sterile speculum. A ring forcep was used to guide the 29F foley balloon through the internal os of the cervix. Foley Balloon filled with 60cc of sterile water. Plug inserted into end of the foley. Foley placed on tension and taped to medial thigh.  NST:  EFM 130's, + accels, no decels  Toco: none There were no signs of tachysystole or hypertonus. All equipment was removed and accounted for. The patient tolerated the procedure well.  Assessment and Plan:  Pregnancy: G4P1021 at [redacted]w[redacted]d 1. Supervision of low-risk pregnancy, unspecified trimester Stable Labor precautions IOL tonight  2. Post term pregnancy, antepartum condition or complication Foley placed without problems   3. History of cesarean delivery, currently pregnant For TOLAC  4. History of gestational hypertension BP stable   S/p Outpatient placement of foley balloon catheter for cervical ripening. Induction of labor scheduled for tomorrow. Reassuring FHR tracing with no concerns at present. Warning signs given to patient to include return to MAU for heavy vaginal bleeding, Rupture of membranes, painful uterine contractions q 5 mins or less, severe abdominal discomfort, decreased fetal movement.  No follow-ups on file.   Chancy Milroy, MD 03/21/2019 2:20 PM

## 2019-03-21 NOTE — Patient Instructions (Signed)

## 2019-03-22 ENCOUNTER — Inpatient Hospital Stay (HOSPITAL_COMMUNITY)
Admission: RE | Admit: 2019-03-22 | Discharge: 2019-03-22 | Disposition: A | Discharge status: Home / Self Care | Payer: Medicaid Other | Source: Ambulatory Visit | Attending: Obstetrics and Gynecology | Admitting: Obstetrics and Gynecology

## 2019-03-22 ENCOUNTER — Encounter (HOSPITAL_COMMUNITY): Payer: Self-pay

## 2019-03-22 ENCOUNTER — Inpatient Hospital Stay (HOSPITAL_COMMUNITY)
Admission: AD | Admit: 2019-03-22 | Discharge: 2019-03-25 | DRG: 785 | Disposition: A | Discharge status: Home / Self Care | Payer: Medicaid Other | Attending: Family Medicine | Admitting: Family Medicine

## 2019-03-22 ENCOUNTER — Other Ambulatory Visit: Payer: Self-pay

## 2019-03-22 DIAGNOSIS — Z3A40 40 weeks gestation of pregnancy: Secondary | ICD-10-CM | POA: Diagnosis not present

## 2019-03-22 DIAGNOSIS — O34211 Maternal care for low transverse scar from previous cesarean delivery: Secondary | ICD-10-CM | POA: Diagnosis present

## 2019-03-22 DIAGNOSIS — O480 Post-term pregnancy: Principal | ICD-10-CM | POA: Diagnosis present

## 2019-03-22 DIAGNOSIS — O34219 Maternal care for unspecified type scar from previous cesarean delivery: Secondary | ICD-10-CM | POA: Diagnosis present

## 2019-03-22 DIAGNOSIS — Z3490 Encounter for supervision of normal pregnancy, unspecified, unspecified trimester: Secondary | ICD-10-CM

## 2019-03-22 DIAGNOSIS — O99214 Obesity complicating childbirth: Secondary | ICD-10-CM | POA: Diagnosis present

## 2019-03-22 DIAGNOSIS — O99210 Obesity complicating pregnancy, unspecified trimester: Secondary | ICD-10-CM | POA: Diagnosis present

## 2019-03-22 DIAGNOSIS — Z8759 Personal history of other complications of pregnancy, childbirth and the puerperium: Secondary | ICD-10-CM

## 2019-03-22 DIAGNOSIS — O99019 Anemia complicating pregnancy, unspecified trimester: Secondary | ICD-10-CM

## 2019-03-22 DIAGNOSIS — Z302 Encounter for sterilization: Secondary | ICD-10-CM | POA: Diagnosis present

## 2019-03-22 DIAGNOSIS — Z98891 History of uterine scar from previous surgery: Secondary | ICD-10-CM

## 2019-03-22 DIAGNOSIS — O42013 Preterm premature rupture of membranes, onset of labor within 24 hours of rupture, third trimester: Secondary | ICD-10-CM | POA: Diagnosis not present

## 2019-03-22 DIAGNOSIS — Z87891 Personal history of nicotine dependence: Secondary | ICD-10-CM

## 2019-03-22 DIAGNOSIS — D582 Other hemoglobinopathies: Secondary | ICD-10-CM

## 2019-03-22 LAB — CBC
HCT: 31.8 % — ABNORMAL LOW (ref 36.0–46.0)
Hemoglobin: 11.1 g/dL — ABNORMAL LOW (ref 12.0–15.0)
MCH: 26.8 pg (ref 26.0–34.0)
MCHC: 34.9 g/dL (ref 30.0–36.0)
MCV: 76.8 fL — ABNORMAL LOW (ref 80.0–100.0)
Platelets: 294 10*3/uL (ref 150–400)
RBC: 4.14 MIL/uL (ref 3.87–5.11)
RDW: 13.7 % (ref 11.5–15.5)
WBC: 10.6 10*3/uL — ABNORMAL HIGH (ref 4.0–10.5)
nRBC: 0 % (ref 0.0–0.2)

## 2019-03-22 LAB — TYPE AND SCREEN
ABO/RH(D): O POS
Antibody Screen: NEGATIVE

## 2019-03-22 LAB — ABO/RH: ABO/RH(D): O POS

## 2019-03-22 MED ORDER — TERBUTALINE SULFATE 1 MG/ML IJ SOLN
0.2500 mg | Freq: Once | INTRAMUSCULAR | Status: AC | PRN
  Administered 2019-03-23: 0.25 mg via SUBCUTANEOUS

## 2019-03-22 MED ORDER — OXYCODONE-ACETAMINOPHEN 5-325 MG PO TABS: 2.0000 | ORAL_TABLET | ORAL | Status: DC | PRN

## 2019-03-22 MED ORDER — ACETAMINOPHEN 325 MG PO TABS: 650.0000 mg | ORAL_TABLET | ORAL | Status: DC | PRN

## 2019-03-22 MED ORDER — OXYCODONE-ACETAMINOPHEN 5-325 MG PO TABS: 1.0000 | ORAL_TABLET | ORAL | Status: DC | PRN

## 2019-03-22 MED ORDER — OXYTOCIN BOLUS FROM INFUSION: 500.0000 mL | Freq: Once | INTRAVENOUS | Status: DC

## 2019-03-22 MED ORDER — LACTATED RINGERS IV SOLN
INTRAVENOUS | Status: DC
  Administered 2019-03-23: 05:00:00 via INTRAVENOUS

## 2019-03-22 MED ORDER — OXYTOCIN 40 UNITS IN NORMAL SALINE INFUSION - SIMPLE MED
1.0000 m[IU]/min | INTRAVENOUS | Status: DC
  Administered 2019-03-22: 22:00:00 2 m[IU]/min via INTRAVENOUS
  Filled 2019-03-22: qty 1000

## 2019-03-22 MED ORDER — MISOPROSTOL 25 MCG QUARTER TABLET: 25.0000 ug | ORAL_TABLET | ORAL | Status: DC | PRN

## 2019-03-22 MED ORDER — SOD CITRATE-CITRIC ACID 500-334 MG/5ML PO SOLN: 30.0000 mL | ORAL | Status: DC | PRN

## 2019-03-22 MED ORDER — OXYTOCIN 40 UNITS IN NORMAL SALINE INFUSION - SIMPLE MED: 2.5000 [IU]/h | INTRAVENOUS | Status: DC

## 2019-03-22 MED ORDER — LIDOCAINE HCL (PF) 1 % IJ SOLN: 30.0000 mL | INTRAMUSCULAR | Status: DC | PRN

## 2019-03-22 MED ORDER — ONDANSETRON HCL 4 MG/2ML IJ SOLN: 4.0000 mg | Freq: Four times a day (QID) | INTRAMUSCULAR | Status: DC | PRN

## 2019-03-22 MED ORDER — TERBUTALINE SULFATE 1 MG/ML IJ SOLN
0.2500 mg | Freq: Once | INTRAMUSCULAR | Status: DC | PRN
  Filled 2019-03-22: qty 1

## 2019-03-22 MED ORDER — LACTATED RINGERS IV SOLN
500.0000 mL | INTRAVENOUS | Status: DC | PRN
  Administered 2019-03-23: 500 mL via INTRAVENOUS

## 2019-03-22 NOTE — MAU Note (Signed)
Pt reports to MAU c/o irregular ctxs. Pt states she has a foley bulb. Pt reports +FM. No bleeding or LOF.

## 2019-03-22 NOTE — H&P (Signed)
OBSTETRIC ADMISSION HISTORY AND PHYSICAL  Kayla [name] is a 33 y.o. female [phone] with IUP at [redacted]w[redacted]d by LMP presenting for IOL for TOLAC and near post-dates. She reports +FMs, No LOF, no VB, no blurry vision, headaches or peripheral edema, and RUQ pain.  She plans on breast feeding. She requests BTL for birth control. She received her prenatal care at [facility]   Dating: By LMP --->  Estimated Date of Delivery: 03/19/19  Sono:    @[redacted]w[redacted]d , CWD, normal anatomy, cephalic presentation, anterior placental lie, 2713g, 30% EFW  Prenatal History/Complications: Maternal obesity Hx of c-section Hx of gestation HTN Questionable hx of HSV; patient has not been on suppression. Reports possible genital infection ~ 7 years ago and no problems since.   Past Medical History: Past Medical History:  Diagnosis Date  . Complication of anesthesia    epidural did not work well with CS  . HSV (herpes simplex virus) infection   . Medical history non-contributory     Past Surgical History: Past Surgical History:  Procedure Laterality Date  . CESAREAN SECTION N/A 11/06/2016   Procedure: CESAREAN SECTION;  Surgeon: Chancy Milroy, MD;  Location: [place];  Service: Obstetrics;  Laterality: N/A;    Obstetrical History: OB History    Gravida  4   Para  1   Term  1   Preterm      AB  2   Living  1     SAB      TAB  2   Ectopic      Multiple  0   Live Births  1           Social History: Social History   Socioeconomic History  . Marital status: Married    Spouse name: Not on file  . Number of children: Not on file  . Years of education: Not on file  . Highest education level: Not on file  Occupational History  . Occupation: Community education officer  Social Needs  . Financial resource strain: Not hard at all  . Food insecurity    Worry: Never true    Inability: Never true  . Transportation needs    Medical: No    Non-medical: No  Tobacco Use  . Smoking status: Former  Research scientist (life sciences)  . Smokeless tobacco: Former Network engineer and Sexual Activity  . Alcohol use: Not Currently    Alcohol/week: 3.0 standard drinks    Types: 3 Standard drinks or equivalent per week    Comment: daily use-wine  . Drug use: No  . Sexual activity: Yes    Birth control/protection: None  Lifestyle  . Physical activity    Days per week: Not on file    Minutes per session: Not on file  . Stress: Not on file  Relationships  . Social Herbalist on phone: Not on file    Gets together: Not on file    Attends religious service: Not on file    Active member of club or organization: Not on file    Attends meetings of clubs or organizations: Not on file    Relationship status: Not on file  Other Topics Concern  . Not on file  Social History Narrative   Single. Education: [employer]. Exercise: Yes.     Family History: Family History  Problem Relation Age of Onset  . Sickle cell anemia Mother   . Heart disease Father   . Hypertension Father   . Cancer Maternal  Grandmother        leukemia  . Diabetes Maternal Grandmother   . Hyperlipidemia Maternal Grandfather   . Heart disease Paternal Grandmother     Allergies: No Known Allergies  Medications Prior to Admission  Medication Sig Dispense Refill Last Dose  . Prenat-FeAsp-Meth-FA-DHA w/o A (PRENATE PIXIE) 10-0.6-0.4-200 MG CAPS Take 1 tablet by mouth daily. 30 capsule 12      Review of Systems   All systems reviewed and negative except as stated in HPI  Last menstrual period 06/12/2018. General appearance: alert, cooperative, appears stated age and no distress Lungs: normal effort Heart: regular rate  Abdomen: soft, non-tender; bowel sounds normal Pelvic: gravid uterus GU: No vaginal lesions  Extremities: Homans sign is negative, no sign of DVT Presentation: cephalic by BSUS Fetal monitoringBaseline: 145 bpm, Variability: Good {> 6 bpm), Accelerations: Reactive and Decelerations: Absent Uterine activity:  None    Prenatal labs: ABO, Rh: O/Positive/-- (03/05 0953) Antibody: Negative (03/05 0953) Rubella: 4.32 (03/05 0953) RPR: Non Reactive (07/01 0905)  HBsAg: Negative (03/05 0953)  HIV: Non Reactive (07/01 0905)  GBS: Negative/-- (09/03 1121)  2 hr Glucola WNL Genetic screening  NIPS normal female Anatomy US WNL  We performed fetal anatomy scan. No makers of  aneuploidies or fetal structural defects are seen. Fetal  biometry is consistent with her previously-established dates.  Amniotic fluid is normal and good fetal activity is seen.  Placenta is anterior and there is no evidence of previa or  accreta.  A small left ovarian simple unilocular cyst measuring 4.2 x 3.3  cm is seen.  Maternal obesity imposes limitations on the resolution of  images, and failure to detect fetal anomalies is more common  in obese pregnant women.  On cell-free fetal DNA screening, the risks of fetal  aneuploidies are not increased.  Prenatal Transfer Tool  Maternal Diabetes: No Genetic Screening: Normal Maternal Ultrasounds/Referrals: Normal Fetal Ultrasounds or other Referrals:  None Maternal Substance Abuse:  No Significant Maternal Medications:  None Significant Maternal Lab Results: Group B Strep negative  No results found for this or any previous visit (from the past 24 hour(s)).  Patient Active Problem List   Diagnosis Date Noted  . Post-dates pregnancy 03/22/2019  . Anemia in pregnancy, unspecified trimester 08/24/2018  . Supervision of low-risk pregnancy, unspecified trimester 08/24/2018  . Obesity in pregnancy, antepartum 08/24/2018  . Hemoglobin C trait ([facility]) 08/24/2018  . History of cesarean delivery, currently pregnant 12/02/2016  . History of gestational hypertension 11/06/2016    Assessment/Plan:  Kayla [name] is a 33 y.o. G4P1021 at [redacted]w[redacted]d here for IOL for post-dates. TOLAC.  #Labor: VBAC consent 12/20/2018. FB placed on 9/30 by Dr. Alysia Penna and still in place. Will start low dose  Pit with max of 6 milliunits while FB in place. Vertex by BSUS.  #Pain: IV pain meds; possibly epidural eventually  #FWB: Cat I; EFW: 3500g #ID:  GBS neg #MOF: breast #MOC: BTL; paperwork signed 12/20/2018; requests [place] method #Circ:  Yes, inpatient   Jerilynn Birkenhead, MD [facility] Family Medicine Fellow, [facility] for [facility], [facility] 03/22/2019, 9:09 PM

## 2019-03-22 NOTE — MAU Provider Note (Signed)
Chief Complaint:  Contractions   HPI: Kayla [name] is a 33 y.o. G4P1021 at [redacted]w[redacted]d by who presents to maternity admissions after foley bulb placed today and supposed to have IOL at midnight. Patient reports intermittent strong ctx but otherwise denies other complaints.  She reports good fetal movement, denies LOF, vaginal bleeding, vaginal itching/burning, urinary symptoms, h/a, dizziness, n/v, or fever/chills.    Past Medical History: Past Medical History:  Diagnosis Date  . Complication of anesthesia    epidural did not work well with CS  . HSV (herpes simplex virus) infection   . Medical history non-contributory     Past obstetric history: OB History  Gravida Para Term Preterm AB Living  SAB TAB Ectopic Multiple Live Births    2   0 1    # Outcome Date GA Lbr Len/2nd Weight Sex Delivery Anes PTL Lv  4 Current           3 Term 11/06/16 [redacted]w[redacted]d  3105 g F CS-LTranv EPI  LIV  2 TAB 2012          1 TAB 2011            Past Surgical History: Past Surgical History:  Procedure Laterality Date  . CESAREAN SECTION N/A 11/06/2016   Procedure: CESAREAN SECTION;  Surgeon: Hermina Staggers, MD;  Location: [facility] BIRTHING SUIT[facility];  Service: Obstetrics;  Laterality: N/A;    Family History: Family History  Problem Relation Age of Onset  . Sickle cell anemia Mother   . Heart disease Father   . Hypertension Father   . Cancer Maternal Grandmother        leukemia  . Diabetes Maternal Grandmother   . Hyperlipidemia Maternal Grandfather   . Heart disease Paternal Grandmother     Social History: Social History   Tobacco Use  . Smoking status: Former Games developer  . Smokeless tobacco: Former Engineer, water Use Topics  . Alcohol use: Not Currently    Alcohol/week: 3.0 standard drinks    Types: 3 Standard drinks or equivalent per week    Comment: daily use-wine  . Drug use: No    Allergies: No Known Allergies  Meds:  Medications Prior to Admission  Medication Sig Dispense  Refill Last Dose  . Prenat-FeAsp-Meth-FA-DHA w/o A (PRENATE PIXIE) 10-0.6-0.4-200 MG CAPS Take 1 tablet by mouth daily. 30 capsule 12     ROS:  Review of Systems All other systems negative unless noted above in HPI.   I have reviewed patient's Past Medical Hx, Surgical Hx, Family Hx, Social Hx, medications and allergies.   Physical Exam   Patient Vitals for the past 24 hrs:  BP Temp Temp src Pulse Resp  03/22/19 0127 115/68 - - 89 -  03/22/19 0050 118/65 98.6 F (37 C) Oral 95 19   Constitutional: Well-developed, well-nourished female in no acute distress.  Cardiovascular: normal rate Respiratory: normal effort GI: Abd soft, non-tender, gravid appropriate for gestational age.  MS: Extremities nontender, no edema, normal ROM Neurologic: Alert and oriented x 4.   Dilation: 2  FHT:  Baseline 130 , moderate variability, accelerations present, one prolonged (5-6 minute) deceleration Contractions: infrequent   Labs: No results found for this or any previous visit (from the past 24 hour(s)). O/Positive/-- (03/05 1610)  Imaging:  [place] Mfm Ob Follow Up  Result Date: 02/23/2019 ----------------------------------------------------------------------  OBSTETRICS REPORT                    (  Corrected Final 02/23/2019 03:12 pm) ---------------------------------------------------------------------- Patient Info  ID #:       [id number]                          D.O.B.:  May 06, 1986 (33 yrs)  Name:       Kayla [name]                      Visit Date: 02/22/2019 01:43 pm ---------------------------------------------------------------------- Performed By  Performed By:     Emeline DarlingKasie E Kiser BS,      Ref. Address:     [address]                    [place]                                                             [address]  Attending:        Ma RingsVictor Fang MD         [facility]  [facility]:      [facility]  ---------------------------------------------------------------------- Orders   #  Description                          Code         Ordered By   1  US MFM OB FOLLOW UP                  [id number]     Noralee SpaceAVI SHANKAR  ----------------------------------------------------------------------   #  Order #                    Accession #                 Episode #   1  [id number]                  [id number]                  [id number]  ---------------------------------------------------------------------- Indications   Obesity complicating pregnancy, third          O99.213   trimester   Previous cesarean delivery, antepartum         O34.219   [redacted] weeks gestation of pregnancy                Z3A.36  ---------------------------------------------------------------------- Vital Signs  Weight (lb): 245                               Height:        5'3"  BMI:         43.4 ---------------------------------------------------------------------- Fetal Evaluation  Num Of Fetuses:         1  Fetal  Heart Rate(bpm):  151  Cardiac Activity:       Observed  Presentation:           Cephalic  Placenta:               Anterior  P. Cord Insertion:      Previously Visualized  Amniotic Fluid  AFI FV:      Within normal limits  AFI Sum(cm)     %Tile       Largest Pocket(cm)  7.82            7           3.78  RUQ(cm)                     LUQ(cm)        LLQ(cm)  3.03                        3.78           1.01 ---------------------------------------------------------------------- Biometry  BPD:      87.1  mm     G. Age:  35w 1d         26  %    CI:        77.19   %    70 - 86                                                          FL/HC:      22.8   %    20.1 - 22.1  HC:      313.9  mm     G. Age:  35w 1d          5  %    HC/AC:      1.00        0.93 - 1.11  AC:      312.4  mm     G. Age:  35w 1d         26  %    FL/BPD:     82.2   %    71 - 87  FL:       71.6  mm     G. Age:  36w 5d         54  %    FL/AC:      22.9   %    20 - 24  Est. FW:    2713  gm            6 lb     30  % ---------------------------------------------------------------------- OB History  Gravidity:    2  Living:       1 ---------------------------------------------------------------------- Gestational Age  LMP:           36w 3d        Date:  06/12/18                 EDD:   03/19/19  U/S Today:     35w 4d                                        EDD:   03/25/19  Best:          36w 3d     Det. By:  LMP  (06/12/18)          EDD:   03/19/19 ---------------------------------------------------------------------- Anatomy  Cranium:               Appears normal         LVOT:                   Previously seen  Cavum:                 Previously seen        Aortic Arch:            Previously seen  Ventricles:            Appears normal         Ductal Arch:            Previously seen  Choroid Plexus:        Previously seen        Diaphragm:              Appears normal  Cerebellum:            Previously seen        Stomach:                Appears normal, left                                                                        sided  Posterior Fossa:       Previously seen        Abdomen:                Appears normal  Nuchal Fold:           Not applicable (>20    Abdominal Wall:         Previously seen                         wks GA)  Face:                  Orbits and profile     Cord Vessels:           Previously seen                         previously seen  Lips:                  Previously seen        Kidneys:                Appear normal  Palate:                Previously seen        Bladder:                Appears normal  Thoracic:              Appears normal         Spine:  Visualized  Heart:                 Appears normal         Upper Extremities:      Previously seen                         (4CH, axis, and                         situs)  RVOT:                  Previously seen        Lower Extremities:      Previously seen  Other:  Heels and 5th digit previously seen.Technically difficult  due to          maternal habitus and fetal position. ---------------------------------------------------------------------- Cervix Uterus Adnexa  Cervix  Not visualized (advanced GA >24wks) ---------------------------------------------------------------------- Comments  This patient was seen for a follow up growth scan due to due  to maternal obesity.  She denies any problems in her current  pregnancy.  She was informed that the fetal growth and amniotic fluid  level appears appropriate for her gestational age.  Follow-up as indicated. ----------------------------------------------------------------------                        Johnell Comings, MD Electronically Signed Corrected Final Report  02/23/2019 03:12 pm ----------------------------------------------------------------------   MAU Course/MDM: Orders Placed This Encounter  Procedures  . Discharge patient Discharge disposition: 01-Home or Self Care; Discharge patient date: 03/22/2019    No orders of the defined types were placed in this encounter.    NST reviewed: Cat II due to one prolonged decel otherwise reassuring.  D/w Dr. Rip Harbour; okay to discharge despite [place]. Patient to be called for induction when bed available.   Pt discharge with strict return  precautions.  Assessment: 1. Labor and delivery, indication for care     Plan: Discharge home Labor precautions and fetal kick counts  Allergies as of 03/22/2019   No Known Allergies     Medication List    TAKE these medications   Prenate Pixie 10-0.6-0.4-200 MG Caps Take 1 tablet by mouth daily.       Barrington Ellison, MD [facility] Family Medicine Fellow, [facility] for [facility] 03/22/2019 3:46 AM

## 2019-03-23 ENCOUNTER — Inpatient Hospital Stay (HOSPITAL_COMMUNITY): Payer: Medicaid Other | Admitting: Anesthesiology

## 2019-03-23 ENCOUNTER — Encounter (HOSPITAL_COMMUNITY)
Admission: AD | Disposition: A | Discharge status: Home / Self Care | Payer: Self-pay | Source: Home / Self Care | Attending: Family Medicine

## 2019-03-23 ENCOUNTER — Encounter (HOSPITAL_COMMUNITY): Payer: Self-pay | Admitting: Anesthesiology

## 2019-03-23 DIAGNOSIS — O480 Post-term pregnancy: Secondary | ICD-10-CM

## 2019-03-23 DIAGNOSIS — Z302 Encounter for sterilization: Secondary | ICD-10-CM

## 2019-03-23 DIAGNOSIS — O34211 Maternal care for low transverse scar from previous cesarean delivery: Secondary | ICD-10-CM

## 2019-03-23 DIAGNOSIS — Z3A40 40 weeks gestation of pregnancy: Secondary | ICD-10-CM

## 2019-03-23 LAB — CBC
HCT: 31.7 % — ABNORMAL LOW (ref 36.0–46.0)
Hemoglobin: 11.4 g/dL — ABNORMAL LOW (ref 12.0–15.0)
MCH: 27.5 pg (ref 26.0–34.0)
MCHC: 36 g/dL (ref 30.0–36.0)
MCV: 76.6 fL — ABNORMAL LOW (ref 80.0–100.0)
Platelets: 273 10*3/uL (ref 150–400)
RBC: 4.14 MIL/uL (ref 3.87–5.11)
RDW: 13.5 % (ref 11.5–15.5)
WBC: 18.7 10*3/uL — ABNORMAL HIGH (ref 4.0–10.5)
nRBC: 0 % (ref 0.0–0.2)

## 2019-03-23 LAB — CREATININE, SERUM
Creatinine, Ser: 0.46 mg/dL (ref 0.44–1.00)
GFR calc Af Amer: >60 mL/min (ref >60)
GFR calc non Af Amer: >60 mL/min (ref >60)

## 2019-03-23 LAB — RPR: RPR Ser Ql: NONREACTIVE

## 2019-03-23 SURGERY — Surgical Case
Anesthesia: Epidural | Site: Abdomen | Wound class: Clean Contaminated

## 2019-03-23 MED ORDER — LACTATED RINGERS IV SOLN
INTRAVENOUS | Status: DC | PRN
  Administered 2019-03-23 (×2): via INTRAVENOUS

## 2019-03-23 MED ORDER — FENTANYL CITRATE (PF) 100 MCG/2ML IJ SOLN
INTRAMUSCULAR | Status: DC | PRN
  Administered 2019-03-23: 15 ug via INTRATHECAL
  Administered 2019-03-23: 100 ug via INTRAVENOUS

## 2019-03-23 MED ORDER — KETOROLAC TROMETHAMINE 30 MG/ML IJ SOLN
INTRAMUSCULAR | Status: AC
  Filled 2019-03-23: qty 1

## 2019-03-23 MED ORDER — SENNOSIDES-DOCUSATE SODIUM 8.6-50 MG PO TABS
2.0000 | ORAL_TABLET | ORAL | Status: DC
  Filled 2019-03-23: qty 2

## 2019-03-23 MED ORDER — COCONUT OIL OIL: 1.0000 "application " | TOPICAL_OIL | Status: DC | PRN

## 2019-03-23 MED ORDER — OXYTOCIN 40 UNITS IN NORMAL SALINE INFUSION - SIMPLE MED: 2.5000 [IU]/h | INTRAVENOUS | Status: AC

## 2019-03-23 MED ORDER — PRENATAL MULTIVITAMIN CH
1.0000 | ORAL_TABLET | Freq: Every day | ORAL | Status: DC
  Administered 2019-03-24 – 2019-03-25 (×2): 1 via ORAL
  Filled 2019-03-23 (×2): qty 1

## 2019-03-23 MED ORDER — STERILE WATER FOR IRRIGATION IR SOLN
Status: DC | PRN
  Administered 2019-03-23: 1

## 2019-03-23 MED ORDER — KETOROLAC TROMETHAMINE 30 MG/ML IJ SOLN: 30.0000 mg | Freq: Four times a day (QID) | INTRAMUSCULAR | Status: AC | PRN

## 2019-03-23 MED ORDER — ACETAMINOPHEN 500 MG PO TABS
1000.0000 mg | ORAL_TABLET | Freq: Four times a day (QID) | ORAL | Status: DC
  Administered 2019-03-24 – 2019-03-25 (×4): 1000 mg via ORAL
  Filled 2019-03-23 (×5): qty 2

## 2019-03-23 MED ORDER — ENOXAPARIN SODIUM 60 MG/0.6ML ~~LOC~~ SOLN
0.5000 mg/kg | SUBCUTANEOUS | Status: DC
  Administered 2019-03-25: 09:00:00 55 mg via SUBCUTANEOUS
  Filled 2019-03-23: qty 0.6

## 2019-03-23 MED ORDER — SCOPOLAMINE 1 MG/3DAYS TD PT72: 1.0000 | MEDICATED_PATCH | Freq: Once | TRANSDERMAL | Status: DC

## 2019-03-23 MED ORDER — DIPHENHYDRAMINE HCL 50 MG/ML IJ SOLN
INTRAMUSCULAR | Status: DC | PRN
  Administered 2019-03-23: 25 mg via INTRAVENOUS

## 2019-03-23 MED ORDER — NALOXONE HCL 4 MG/10ML IJ SOLN
1.0000 ug/kg/h | INTRAVENOUS | Status: DC | PRN
  Filled 2019-03-23: qty 5

## 2019-03-23 MED ORDER — CEFAZOLIN SODIUM-DEXTROSE 2-4 GM/100ML-% IV SOLN
INTRAVENOUS | Status: AC
  Filled 2019-03-23: qty 100

## 2019-03-23 MED ORDER — MORPHINE SULFATE (PF) 0.5 MG/ML IJ SOLN
INTRAMUSCULAR | Status: AC
  Filled 2019-03-23: qty 10

## 2019-03-23 MED ORDER — PHENYLEPHRINE HCL (PRESSORS) 10 MG/ML IV SOLN
INTRAVENOUS | Status: DC | PRN
  Administered 2019-03-23 (×2): 80 ug via INTRAVENOUS

## 2019-03-23 MED ORDER — KETOROLAC TROMETHAMINE 30 MG/ML IJ SOLN: 30.0000 mg | Freq: Once | INTRAMUSCULAR | Status: DC

## 2019-03-23 MED ORDER — DIPHENHYDRAMINE HCL 50 MG/ML IJ SOLN
INTRAMUSCULAR | Status: AC
  Filled 2019-03-23: qty 1

## 2019-03-23 MED ORDER — ONDANSETRON HCL 4 MG/2ML IJ SOLN: 4.0000 mg | Freq: Three times a day (TID) | INTRAMUSCULAR | Status: DC | PRN

## 2019-03-23 MED ORDER — DIBUCAINE (PERIANAL) 1 % EX OINT: 1.0000 "application " | TOPICAL_OINTMENT | CUTANEOUS | Status: DC | PRN

## 2019-03-23 MED ORDER — IBUPROFEN 800 MG PO TABS
800.0000 mg | ORAL_TABLET | Freq: Four times a day (QID) | ORAL | Status: DC
  Administered 2019-03-24 – 2019-03-25 (×3): 800 mg via ORAL
  Filled 2019-03-23 (×3): qty 1

## 2019-03-23 MED ORDER — FENTANYL-BUPIVACAINE-NACL 0.5-0.125-0.9 MG/250ML-% EP SOLN
EPIDURAL | Status: AC
  Filled 2019-03-23: qty 250

## 2019-03-23 MED ORDER — NALBUPHINE HCL 10 MG/ML IJ SOLN
5.0000 mg | INTRAMUSCULAR | Status: DC | PRN
  Filled 2019-03-23: qty 0.5

## 2019-03-23 MED ORDER — SODIUM CHLORIDE 0.9 % IR SOLN
Status: DC | PRN
  Administered 2019-03-23: 1

## 2019-03-23 MED ORDER — SODIUM CHLORIDE (PF) 0.9 % IJ SOLN
INTRAMUSCULAR | Status: DC | PRN
  Administered 2019-03-23: 12 mL/h via EPIDURAL

## 2019-03-23 MED ORDER — EPHEDRINE 5 MG/ML INJ: 10.0000 mg | INTRAVENOUS | Status: DC | PRN

## 2019-03-23 MED ORDER — KETOROLAC TROMETHAMINE 30 MG/ML IJ SOLN: 30.0000 mg | Freq: Once | INTRAMUSCULAR | Status: DC | PRN

## 2019-03-23 MED ORDER — MEPERIDINE HCL 25 MG/ML IJ SOLN: 6.2500 mg | INTRAMUSCULAR | Status: DC | PRN

## 2019-03-23 MED ORDER — PHENYLEPHRINE 40 MCG/ML (10ML) SYRINGE FOR IV PUSH (FOR BLOOD PRESSURE SUPPORT): 80.0000 ug | PREFILLED_SYRINGE | INTRAVENOUS | Status: DC | PRN

## 2019-03-23 MED ORDER — OXYTOCIN 40 UNITS IN NORMAL SALINE INFUSION - SIMPLE MED
INTRAVENOUS | Status: AC
  Filled 2019-03-23: qty 1000

## 2019-03-23 MED ORDER — FENTANYL CITRATE (PF) 100 MCG/2ML IJ SOLN
INTRAMUSCULAR | Status: AC
  Filled 2019-03-23: qty 2

## 2019-03-23 MED ORDER — PHENYLEPHRINE 40 MCG/ML (10ML) SYRINGE FOR IV PUSH (FOR BLOOD PRESSURE SUPPORT)
80.0000 ug | PREFILLED_SYRINGE | INTRAVENOUS | Status: DC | PRN
  Administered 2019-03-23 (×2): 80 ug via INTRAVENOUS

## 2019-03-23 MED ORDER — ACETAMINOPHEN 500 MG PO TABS: 1000.0000 mg | ORAL_TABLET | Freq: Four times a day (QID) | ORAL | Status: DC

## 2019-03-23 MED ORDER — NALBUPHINE HCL 10 MG/ML IJ SOLN
5.0000 mg | Freq: Once | INTRAMUSCULAR | Status: DC | PRN
  Filled 2019-03-23: qty 0.5

## 2019-03-23 MED ORDER — KETOROLAC TROMETHAMINE 30 MG/ML IJ SOLN
30.0000 mg | Freq: Four times a day (QID) | INTRAMUSCULAR | Status: AC | PRN
  Administered 2019-03-23 – 2019-03-24 (×2): 30 mg via INTRAMUSCULAR

## 2019-03-23 MED ORDER — SIMETHICONE 80 MG PO CHEW
80.0000 mg | CHEWABLE_TABLET | Freq: Three times a day (TID) | ORAL | Status: DC
  Administered 2019-03-25: 80 mg via ORAL
  Filled 2019-03-23 (×3): qty 1

## 2019-03-23 MED ORDER — OXYCODONE HCL 5 MG/5ML PO SOLN: 5.0000 mg | Freq: Once | ORAL | Status: DC | PRN

## 2019-03-23 MED ORDER — SODIUM CHLORIDE 0.9 % IV SOLN
INTRAVENOUS | Status: AC
  Filled 2019-03-23: qty 500

## 2019-03-23 MED ORDER — DIPHENHYDRAMINE HCL 50 MG/ML IJ SOLN: 12.5000 mg | INTRAMUSCULAR | Status: DC | PRN

## 2019-03-23 MED ORDER — TETANUS-DIPHTH-ACELL PERTUSSIS 5-2.5-18.5 LF-MCG/0.5 IM SUSP: 0.5000 mL | Freq: Once | INTRAMUSCULAR | Status: DC

## 2019-03-23 MED ORDER — SODIUM CHLORIDE 0.9 % IV SOLN
INTRAVENOUS | Status: DC | PRN
  Administered 2019-03-23: 14:00:00 via INTRAVENOUS

## 2019-03-23 MED ORDER — PROMETHAZINE HCL 25 MG/ML IJ SOLN: 6.2500 mg | INTRAMUSCULAR | Status: DC | PRN

## 2019-03-23 MED ORDER — KETOROLAC TROMETHAMINE 30 MG/ML IJ SOLN
30.0000 mg | Freq: Four times a day (QID) | INTRAMUSCULAR | Status: AC
  Filled 2019-03-23: qty 1

## 2019-03-23 MED ORDER — MORPHINE SULFATE (PF) 0.5 MG/ML IJ SOLN
INTRAMUSCULAR | Status: DC | PRN
  Administered 2019-03-23: .15 mg via INTRATHECAL

## 2019-03-23 MED ORDER — OXYCODONE HCL 5 MG PO TABS: 5.0000 mg | ORAL_TABLET | Freq: Once | ORAL | Status: DC | PRN

## 2019-03-23 MED ORDER — LIDOCAINE HCL (PF) 1 % IJ SOLN
INTRAMUSCULAR | Status: DC | PRN
  Administered 2019-03-23: 10 mL via EPIDURAL

## 2019-03-23 MED ORDER — BUPIVACAINE IN DEXTROSE 0.75-8.25 % IT SOLN
INTRATHECAL | Status: DC | PRN
  Administered 2019-03-23: 1.1 mL via INTRATHECAL

## 2019-03-23 MED ORDER — SODIUM CHLORIDE 0.9 % IV SOLN: 500.0000 mg | Freq: Once | INTRAVENOUS | Status: DC

## 2019-03-23 MED ORDER — HYDROMORPHONE HCL 1 MG/ML IJ SOLN: 0.2500 mg | INTRAMUSCULAR | Status: DC | PRN

## 2019-03-23 MED ORDER — PHENYLEPHRINE 40 MCG/ML (10ML) SYRINGE FOR IV PUSH (FOR BLOOD PRESSURE SUPPORT)
PREFILLED_SYRINGE | INTRAVENOUS | Status: AC
  Administered 2019-03-23: 02:00:00 80 ug via INTRAVENOUS
  Filled 2019-03-23: qty 10

## 2019-03-23 MED ORDER — PHENYLEPHRINE HCL-NACL 20-0.9 MG/250ML-% IV SOLN
INTRAVENOUS | Status: DC | PRN
  Administered 2019-03-23: 60 ug/min via INTRAVENOUS

## 2019-03-23 MED ORDER — NALBUPHINE HCL 10 MG/ML IJ SOLN
5.0000 mg | INTRAMUSCULAR | Status: DC | PRN
  Administered 2019-03-23 – 2019-03-24 (×2): 5 mg via INTRAVENOUS
  Filled 2019-03-23 (×4): qty 0.5

## 2019-03-23 MED ORDER — SODIUM CHLORIDE 0.9% FLUSH: 3.0000 mL | INTRAVENOUS | Status: DC | PRN

## 2019-03-23 MED ORDER — SODIUM CHLORIDE 0.9 % IV SOLN
INTRAVENOUS | Status: DC | PRN
  Administered 2019-03-23: 500 mg via INTRAVENOUS

## 2019-03-23 MED ORDER — NALOXONE HCL 0.4 MG/ML IJ SOLN: 0.4000 mg | INTRAMUSCULAR | Status: DC | PRN

## 2019-03-23 MED ORDER — LACTATED RINGERS IV SOLN: 500.0000 mL | Freq: Once | INTRAVENOUS | Status: DC

## 2019-03-23 MED ORDER — CEFAZOLIN SODIUM-DEXTROSE 2-4 GM/100ML-% IV SOLN
2.0000 g | Freq: Once | INTRAVENOUS | Status: AC
  Administered 2019-03-23: 14:00:00 2 g via INTRAVENOUS

## 2019-03-23 MED ORDER — SIMETHICONE 80 MG PO CHEW
80.0000 mg | CHEWABLE_TABLET | ORAL | Status: DC
  Filled 2019-03-23: qty 1

## 2019-03-23 MED ORDER — HYDROMORPHONE HCL 1 MG/ML IJ SOLN: 0.2000 mg | INTRAMUSCULAR | Status: DC | PRN

## 2019-03-23 MED ORDER — ONDANSETRON HCL 4 MG/2ML IJ SOLN
INTRAMUSCULAR | Status: DC | PRN
  Administered 2019-03-23: 4 mg via INTRAVENOUS

## 2019-03-23 MED ORDER — WITCH HAZEL-GLYCERIN EX PADS: 1.0000 "application " | MEDICATED_PAD | CUTANEOUS | Status: DC | PRN

## 2019-03-23 MED ORDER — DIPHENHYDRAMINE HCL 25 MG PO CAPS: 25.0000 mg | ORAL_CAPSULE | ORAL | Status: DC | PRN

## 2019-03-23 MED ORDER — FENTANYL-BUPIVACAINE-NACL 0.5-0.125-0.9 MG/250ML-% EP SOLN: 12.0000 mL/h | EPIDURAL | Status: DC | PRN

## 2019-03-23 MED ORDER — MENTHOL 3 MG MT LOZG: 1.0000 | LOZENGE | OROMUCOSAL | Status: DC | PRN

## 2019-03-23 MED ORDER — SODIUM CHLORIDE 0.9 % IV SOLN
INTRAVENOUS | Status: DC | PRN
  Administered 2019-03-23: 40 [IU] via INTRAVENOUS

## 2019-03-23 MED ORDER — LACTATED RINGERS IV SOLN
INTRAVENOUS | Status: DC
  Administered 2019-03-23: 22:00:00 via INTRAVENOUS

## 2019-03-23 MED ORDER — ZOLPIDEM TARTRATE 5 MG PO TABS: 5.0000 mg | ORAL_TABLET | Freq: Every evening | ORAL | Status: DC | PRN

## 2019-03-23 MED ORDER — PHENYLEPHRINE HCL-NACL 20-0.9 MG/250ML-% IV SOLN
INTRAVENOUS | Status: AC
  Filled 2019-03-23: qty 250

## 2019-03-23 MED ORDER — BUPIVACAINE HCL (PF) 0.25 % IJ SOLN
INTRAMUSCULAR | Status: DC | PRN
  Administered 2019-03-23: 10 mL via EPIDURAL

## 2019-03-23 MED ORDER — SIMETHICONE 80 MG PO CHEW
80.0000 mg | CHEWABLE_TABLET | ORAL | Status: DC | PRN
  Administered 2019-03-23: 80 mg via ORAL

## 2019-03-23 MED ORDER — OXYCODONE HCL 5 MG PO TABS
5.0000 mg | ORAL_TABLET | ORAL | Status: DC | PRN
  Administered 2019-03-24 (×2): 5 mg via ORAL
  Administered 2019-03-24 – 2019-03-25 (×2): 10 mg via ORAL
  Filled 2019-03-23 (×2): qty 2
  Filled 2019-03-23 (×2): qty 1

## 2019-03-23 MED ORDER — DIPHENHYDRAMINE HCL 25 MG PO CAPS: 25.0000 mg | ORAL_CAPSULE | Freq: Four times a day (QID) | ORAL | Status: DC | PRN

## 2019-03-23 SURGICAL SUPPLY — 41 items
APL SKNCLS STERI-STRIP NONHPOA (GAUZE/BANDAGES/DRESSINGS) ×2
BENZOIN TINCTURE PRP APPL 2/3 (GAUZE/BANDAGES/DRESSINGS) ×4 IMPLANT
CHLORAPREP W/TINT 26ML (MISCELLANEOUS) ×4 IMPLANT
CLAMP CORD UMBIL (MISCELLANEOUS) IMPLANT
CLOSURE WOUND 1/2 X4 (GAUZE/BANDAGES/DRESSINGS) ×1
CLOTH BEACON ORANGE TIMEOUT ST (SAFETY) ×4 IMPLANT
DRSG OPSITE POSTOP 4X10 (GAUZE/BANDAGES/DRESSINGS) ×4 IMPLANT
ELECT REM PT RETURN 9FT ADLT (ELECTROSURGICAL) ×4
ELECTRODE REM PT RTRN 9FT ADLT (ELECTROSURGICAL) ×2 IMPLANT
EXTRACTOR VACUUM M CUP 4 TUBE (SUCTIONS) IMPLANT
EXTRACTOR VACUUM M CUP 4' TUBE (SUCTIONS)
GLOVE BIOGEL PI IND STRL 7.0 (GLOVE) ×4 IMPLANT
GLOVE BIOGEL PI IND STRL 7.5 (GLOVE) ×4 IMPLANT
GLOVE BIOGEL PI INDICATOR 7.0 (GLOVE) ×4
GLOVE BIOGEL PI INDICATOR 7.5 (GLOVE) ×4
GLOVE ECLIPSE 7.5 STRL STRAW (GLOVE) ×4 IMPLANT
GOWN STRL REUS W/TWL LRG LVL3 (GOWN DISPOSABLE) ×12 IMPLANT
HOVERMATT SINGLE USE (MISCELLANEOUS) ×2 IMPLANT
KIT ABG SYR 3ML LUER SLIP (SYRINGE) IMPLANT
NDL HYPO 25X5/8 SAFETYGLIDE (NEEDLE) IMPLANT
NEEDLE HYPO 25X5/8 SAFETYGLIDE (NEEDLE) IMPLANT
NS IRRIG 1000ML POUR BTL (IV SOLUTION) ×4 IMPLANT
PACK C SECTION WH (CUSTOM PROCEDURE TRAY) ×4 IMPLANT
PAD OB MATERNITY 4.3X12.25 (PERSONAL CARE ITEMS) ×4 IMPLANT
PENCIL SMOKE EVAC W/HOLSTER (ELECTROSURGICAL) ×4 IMPLANT
RTRCTR C-SECT PINK 25CM LRG (MISCELLANEOUS) ×4 IMPLANT
STRIP CLOSURE SKIN 1/2X4 (GAUZE/BANDAGES/DRESSINGS) ×3 IMPLANT
SUT PLAIN 2 0 (SUTURE) ×4
SUT PLAIN ABS 2-0 CT1 27XMFL (SUTURE) IMPLANT
SUT VIC AB 0 CT1 27 (SUTURE) ×16
SUT VIC AB 0 CT1 27XBRD ANBCTR (SUTURE) IMPLANT
SUT VIC AB 0 CTX 36 (SUTURE) ×12
SUT VIC AB 0 CTX36XBRD ANBCTRL (SUTURE) ×6 IMPLANT
SUT VIC AB 2-0 CT1 27 (SUTURE) ×4
SUT VIC AB 2-0 CT1 TAPERPNT 27 (SUTURE) ×2 IMPLANT
SUT VIC AB 3-0 SH 27 (SUTURE) ×4
SUT VIC AB 3-0 SH 27X BRD (SUTURE) IMPLANT
SUT VIC AB 4-0 KS 27 (SUTURE) ×4 IMPLANT
TOWEL OR 17X24 6PK STRL BLUE (TOWEL DISPOSABLE) ×4 IMPLANT
TRAY FOLEY W/BAG SLVR 14FR LF (SET/KITS/TRAYS/PACK) ×4 IMPLANT
WATER STERILE IRR 1000ML POUR (IV SOLUTION) ×4 IMPLANT

## 2019-03-23 NOTE — Op Note (Addendum)
Operative Note   SURGERY DATE: 03/23/2019  PRE-OP DIAGNOSIS:  *Pregnancy at 40 weeks *Elective Repeat Cesarean Section  POST-OP DIAGNOSIS:  *Pregnancy at 40 weeks *Elective Repeat Cesarean Section   PROCEDURE: repeat low transverse cesarean section via pfannenstiel skin incision with double layer uterine closure and bilateral saplingectomy  SURGEON: Surgeon(s) and Role:    * Stinson, Jacob J, DO - Primary    * Rashawn Rolon L, DO - Fellow  ASSISTANT: None  ANESTHESIA: spinal  ESTIMATED BLOOD LOSS: 478 mL  DRAINS: 225 mL UOP via indwelling foley  TOTAL IV FLUIDS: 1400 mL crystalloid  VTE PROPHYLAXIS: SCDs to bilateral lower extremities  ANTIBIOTICS: Two grams of Cefazolin and 500 mg Zithromax were given, within 1 hour of skin incision  SPECIMENS: Left and right fallopian tubes  COMPLICATIONS: None  INDICATIONS:  Called to patient's room. She is starting to become more uncomfortable and does not want to continue to labor after cesarean section. Baby has had a couple of decels, but returns to baseline. She would still like to proceed with repeat cesarean delivery with salpingectomy.  The risks of cesarean section discussed with the patient included but were not limited to: bleeding which may require transfusion or reoperation; infection which may require antibiotics; injury to bowel, bladder, ureters or other surrounding organs; injury to the fetus; need for additional procedures including hysterectomy in the event of a life-threatening hemorrhage; placental abnormalities wth subsequent pregnancies, incisional problems, thromboembolic phenomenon and other postoperative/anesthesia complications. The patient concurred with the proposed plan, giving informed written consent for the procedure.   Patient has been NPO since last night, she will remain NPO for procedure. Anesthesia and OR aware.  Preoperative prophylactic Ancef and azithromycin ordered on call to the OR.  To OR when  ready.  FINDINGS: No intra-abdominal adhesions were noted. Grossly normal uterus, tubes and ovaries. Clear amniotic fluid, cephalic OP female infant, weight per medical record, APGARs 9/9, intact placenta.  PROCEDURE IN DETAIL: The patient was taken to the operating room where anesthesia was administered and normal fetal heart tones were confirmed. She was then prepped and draped in the normal fashion in the dorsal supine position with a leftward tilt.  After a time out was performed, a pfannensteil skin incision was made with the scalpel and carried through to the underlying layer of fascia. The fascia was then incised at the midline and this incision was extended laterally with the mayo scissors. Attention was turned to the superior aspect of the fascial incision which was grasped with the kocher clamps x 2, tented up and the rectus muscles were dissected off with the mayo scissors. In a similar fashion the inferior aspect of the fascial incision was grasped with the kocher clamps, tented up and the rectus muscles dissected off with the mayo scissors. The rectus muscles were then separated in the midline and the peritoneum was entered bluntly. The Alexis retractor was inserted and the vesicouterine peritoneum was identified.   A low transverse hysterotomy was made with the scalpel until the endometrial cavity was breached. This incision was extended bluntly and the infant's head, shoulders and body were delivered atraumatically.The cord was clamped x 2 and cut, and the infant was handed to the awaiting pediatricians, after delayed cord clamping was done.  The placenta was then gradually expressed from the uterus and then cleared of all clots and debris. The hysterotomy was repaired with a running suture of 1-0 Vicryl. A second imbricating layer of 1-0 Vicryl suture was then placed.  Several figure-of-eight sutures of Vicryl were added to achieve excellent hemostasis.   The Alexis retractor was removed and the  uterus was exteriorized and each Fallopian tube was separated from the underlying mesosalpinx using the clamp, cut, and ligate technique. 0 vicryl was used. Hemostasis was noted.  The uterus and adnexa were then returned to the abdomen, and the hysterotomy and all operative sites were reinspected and excellent hemostasis was noted.  The peritoneum was closed with an interrupted stitch of 3-0 Vicryl. The fascia was reapproximated with 0 Vicryl in a simple running fashion bilaterally. The subcutaneous layer was then reapproximated with interrupted sutures of 2-0 plain gut, and the skin was then closed with 4-0 vicryl, in a subcuticular fashion.  The patient  tolerated the procedure well. Sponge, lap, needle, and instrument counts were correct x 2. The patient was transferred to the recovery room awake, alert and breathing independently in stable condition.  Marlowe Alt, DO OB Fellow, [facility] 03/23/2019 3:54 PM

## 2019-03-23 NOTE — Progress Notes (Signed)
   Kayla [name] is a 33 y.o. [id number] at [redacted]w[redacted]d  admitted for induction of labor due to Post dates. She is also planning a TOLAC.   Subjective: Comfortable now that epidural has been adjusted.   Objective: Vitals:   03/23/19 1001 03/23/19 1026 03/23/19 1033 03/23/19 1102  BP: 107/61  (!) 127/54 (!) 103/52  Pulse: (!) 121  (!) 118 (!) 121  Resp:  18  20  Temp:      TempSrc:      SpO2: 97% 99%    Weight:      Height:       No intake/output data recorded.  FHT:  FHR: 135 bpm, variability: moderate,  accelerations:  Present,  decelerations:  Absent UC:   irregular, every q 3  minutes SVE:   Dilation: 6.5 Effacement (%): 80 Station: 0 Exam by:: Joylene Igo RN Pitocin @ 3 mu/min  Labs: Lab Results  Component Value Date   WBC 10.6 (H) 03/22/2019   HGB 11.1 (L) 03/22/2019   HCT 31.8 (L) 03/22/2019   MCV 76.8 (L) 03/22/2019   PLT 294 03/22/2019    Assessment / Plan: now in active labor; patient still desires TOLAC. Pitocin will be restarted at 2; continue to titrate slowly.   Labor: active Fetal Wellbeing:  Category I Pain Control:  Epidural Anticipated MOD:  NSVD  Mervyn Skeeters Theordore Cisnero 03/23/2019, 11:08 AM

## 2019-03-23 NOTE — Anesthesia Procedure Notes (Signed)
Epidural Patient location during procedure: OB Start time: 03/23/2019 1:50 AM End time: 03/23/2019 2:01 AM  Staffing Anesthesiologist: Lidia Collum, MD Performed: anesthesiologist   Preanesthetic Checklist Completed: patient identified, pre-op evaluation, timeout performed, IV checked, risks and benefits discussed and monitors and equipment checked  Epidural Patient position: sitting Prep: DuraPrep Patient monitoring: heart rate, continuous pulse ox and blood pressure Approach: midline Location: L3-L4 Injection technique: LOR air  Needle:  Needle type: Tuohy  Needle gauge: 17 G Needle length: 9 cm Needle insertion depth: 7 cm Catheter type: closed end flexible Catheter size: 19 Gauge Catheter at skin depth: 12 cm Test dose: negative  Assessment Events: blood not aspirated, injection not painful, no injection resistance, negative IV test and no paresthesia  Additional Notes Reason for block:procedure for pain

## 2019-03-23 NOTE — Anesthesia Procedure Notes (Signed)
Spinal  Patient location during procedure: OR Start time: 03/23/2019 1:30 PM End time: 03/23/2019 1:40 PM Staffing Anesthesiologist: Pervis Hocking, DO Performed: anesthesiologist  Preanesthetic Checklist Completed: patient identified, surgical consent, pre-op evaluation, timeout performed, IV checked, risks and benefits discussed and monitors and equipment checked Spinal Block Patient position: sitting Prep: site prepped and draped and DuraPrep Patient monitoring: cardiac monitor, continuous pulse ox and blood pressure Approach: midline Location: L3-4 Injection technique: single-shot Needle Needle type: Pencan  Needle gauge: 24 G Needle length: 9 cm Assessment Sensory level: T6 Additional Notes Epidural with small window, d/w patient her options for C section and decided to proceed with epidural removal and spinal placement

## 2019-03-23 NOTE — Anesthesia Preprocedure Evaluation (Signed)
Anesthesia Evaluation  Patient identified by MRN, date of birth, ID band Patient awake    Reviewed: Allergy & Precautions, H&P , NPO status , Patient's Chart, lab work & pertinent test results  History of Anesthesia Complications Negative for: history of anesthetic complications  Airway Mallampati: II  TM Distance: >3 FB Neck ROM: full    Dental no notable dental hx.    Pulmonary neg pulmonary ROS, former smoker,    Pulmonary exam normal        Cardiovascular negative cardio ROS Normal cardiovascular exam Rhythm:regular Rate:Normal     Neuro/Psych negative neurological ROS  negative psych ROS   GI/Hepatic negative GI ROS, Neg liver ROS,   Endo/Other  Morbid obesity  Renal/GU negative Renal ROS  negative genitourinary   Musculoskeletal negative musculoskeletal ROS (+)   Abdominal   Peds  Hematology negative hematology ROS (+)   Anesthesia Other Findings   Reproductive/Obstetrics (+) Pregnancy                             Anesthesia Physical Anesthesia Plan  ASA: III  Anesthesia Plan: Epidural   Post-op Pain Management:    Induction:   PONV Risk Score and Plan:   Airway Management Planned:   Additional Equipment:   Intra-op Plan:   Post-operative Plan:   Informed Consent: I have reviewed the patients History and Physical, chart, labs and discussed the procedure including the risks, benefits and alternatives for the proposed anesthesia with the patient or authorized representative who has indicated his/her understanding and acceptance.       Plan Discussed with:   Anesthesia Plan Comments:         Anesthesia Quick Evaluation

## 2019-03-23 NOTE — Progress Notes (Signed)
Labor Progress Note Kayla [name] is a 33 y.o. [id number] at [redacted]w[redacted]d presented for IOL for post-dates. Hx of C-section. S: Feeling ctx. Rating at 4/10. Denies pain medication at this time.   O:  BP 118/80   Pulse 91   Temp 98.4 F (36.9 C) (Oral)   Resp 17   Ht 5\' 3"  (1.6 m)   Wt 112.5 kg   LMP 06/12/2018   BMI 43.93 kg/m  EFM: 130, reactive, moderate variability, pos accels, no decels Ctx: q50m  CVE: Dilation: 5 Effacement (%): 60 Cervical Position: Middle Station: -3 Presentation: Vertex Exam by:: Ola Spurr, RN   A&P: 33 y.o. [id number] [redacted]w[redacted]d here for IOL for post-dates. Hx of C-section. #Labor: VBAC consent 12/20/2018. FB fell out and now progressing nicely. Cont low dose Pit. Repeat SVE in 4 hours or as clinically indicated.  #Pain:  IV pain meds; possibly epidural eventually  #FWB: Cat I; EFW: 3500g #ID:      GBS neg  Chauncey Mann, MD 12:41 AM

## 2019-03-23 NOTE — Transfer of Care (Signed)
Immediate Anesthesia Transfer of Care Note  Patient: [facility]  Procedure(s) Performed: CESAREAN SECTION WITH BILATERAL TUBAL LIGATION (N/A Abdomen)  Patient Location: PACU  Anesthesia Type:Spinal  Level of Consciousness: awake and alert   Airway & Oxygen Therapy: Patient Spontanous Breathing  Post-op Assessment: Report given to RN and Post -op Vital signs reviewed and stable  Post vital signs: Reviewed  Last Vitals:  Vitals Value Taken Time  BP    Temp    Pulse    Resp    SpO2      Last Pain:  Vitals:   03/23/19 1232  TempSrc: Oral  PainSc:          Complications: No apparent anesthesia complications

## 2019-03-23 NOTE — Progress Notes (Signed)
Patient ID: Kayla [name], female   DOB: [birth year], 33 y.o.   MRN: [id number]  Called to patient's room. She is starting to become more uncomfortable and does not want to continue to labor after cesarean section. Baby has had a couple of decels, but returns to baseline. She would still like to proceed with cesarean delivery with salpingectomy.  The risks of cesarean section discussed with the patient included but were not limited to: bleeding which may require transfusion or reoperation; infection which may require antibiotics; injury to bowel, bladder, ureters or other surrounding organs; injury to the fetus; need for additional procedures including hysterectomy in the event of a life-threatening hemorrhage; placental abnormalities wth subsequent pregnancies, incisional problems, thromboembolic phenomenon and other postoperative/anesthesia complications. The patient concurred with the proposed plan, giving informed written consent for the procedure.   Patient has been NPO since last night, she will remain NPO for procedure. Anesthesia and OR aware.  Preoperative prophylactic Ancef and azithromycin ordered on call to the OR.  To OR when ready.  Truett Mainland, DO 03/23/2019 1:00 PM

## 2019-03-23 NOTE — Progress Notes (Signed)
Labor Progress Note Kayla [name] is a 33 y.o. [id number] at [redacted]w[redacted]d presented for IOL for post-dates. Hx of C-section. S: Went to room for prolonged decel. Patient continues to be uncomfortable.   O:  BP 139/87   Pulse (!) 115   Temp 97.6 F (36.4 C) (Oral)   Resp 18   Ht 5\' 3"  (1.6 m)   Wt 112.5 kg   LMP 06/12/2018   SpO2 99%   BMI 43.93 kg/m  EFM: prolonged decel, recovered to 120's  CVE: Dilation: 6.5 Effacement (%): 80 Cervical Position: Middle Station: 0 Presentation: Vertex Exam by:: Ola Spurr, RN   A&P: 33 y.o. [id number] [redacted]w[redacted]d here for IOL for post-dates. Hx of C-section. #Labor: VBAC consent 12/20/2018. AROM at 0445 with clear fluid. Pit turned off again. Terb given. FSE and IUPC placed. Restart Pit at 2 if reactive NST x20 minutes #Pain:  IV pain meds; possibly epidural eventually  #FWB: Cat II; EFW: 3500g #ID:      GBS neg  Chauncey Mann, MD 7:50 AM

## 2019-03-23 NOTE — Anesthesia Postprocedure Evaluation (Signed)
Anesthesia Post Note  Patient: Kayla [name]  Procedure(s) Performed: CESAREAN SECTION WITH BILATERAL TUBAL LIGATION (N/A Abdomen)     Patient location during evaluation: PACU Anesthesia Type: Spinal Level of consciousness: oriented and awake and alert Pain management: pain level controlled Vital Signs Assessment: post-procedure vital signs reviewed and stable Respiratory status: spontaneous breathing, respiratory function stable and patient connected to nasal cannula oxygen Cardiovascular status: blood pressure returned to baseline and stable Postop Assessment: no headache, no backache, no apparent nausea or vomiting and spinal receding Anesthetic complications: no    Last Vitals:  Vitals:   03/23/19 1625 03/23/19 1639  BP: 121/75 103/73  Pulse:  (!) 107  Resp: 20 20  Temp:  36.7 C  SpO2:      Last Pain:  Vitals:   03/23/19 1639  TempSrc: Oral  PainSc:    Pain Goal:                Epidural/Spinal Function Cutaneous sensation: Normal sensation (03/23/19 1625), Patient able to flex knees: Yes (03/23/19 1625), Patient able to lift hips off bed: Yes (03/23/19 1625), Back pain beyond tenderness at insertion site: No (03/23/19 1625), Progressively worsening motor and/or sensory loss: No (03/23/19 1625), Bowel and/or bladder incontinence post epidural: No (03/23/19 [place])  Pervis Hocking

## 2019-03-23 NOTE — Discharge Summary (Signed)
Postpartum Discharge Summary     Patient Name: Kayla [name] DOB: Jun 04, 1986 MRN: [id number]  Date of admission: 03/22/2019 Delivering Provider: Truett Mainland   Date of discharge: 03/25/2019  Admitting diagnosis: Induction Intrauterine pregnancy: 108w4d    Secondary diagnosis:  Active Problems:   History of gestational hypertension   History of cesarean delivery, currently pregnant   Obesity in pregnancy, antepartum   Post-dates pregnancy  Additional problems: None     Discharge diagnosis: Term Pregnancy Delivered                                                                                                Post partum procedures:None  Augmentation: AROM, Pitocin and Foley Balloon  Complications: None  Hospital course:  Induction of Labor With Cesarean Section  33y.o. yo [id number] 447w4das admitted to the hospital 03/22/2019 for induction of labor. Patient had a labor course significant for FB placement, AROM and pitocin. The patient went for cesarean section due to Elective Repeat, and delivered a Viable infant,03/23/2019  Membrane Rupture Time/Date: 4:45 AM ,03/23/2019   Details of operation can be found in separate operative Note.  Patient had an uncomplicated postpartum course. She is ambulating, tolerating a regular diet, passing flatus, and urinating well.  Patient is discharged home in stable condition on 03/25/19.                                   Delivery time: 2:05 PM    Magnesium Sulfate received: No BMZ received: No Rhophylac:N/A MMR:N/A Transfusion:No  Physical exam  Vitals:   03/24/19 0548 03/24/19 1434 03/24/19 2109 03/25/19 0516  BP: 110/72 111/67 123/68 108/70  Pulse: 100 99 100 90  Resp: _0 Temp: 98.6 F (37 C) 98.2 F (36.8 C) 98.3 F (36.8 C) 97.7 F (36.5 C)  TempSrc:  Oral Oral Oral  SpO2: 99% 96% 100% 100%  Weight:      Height:       General: alert and cooperative Lochia: appropriate Uterine Fundus: firm Incision: Dressing  is clean, dry, and intact DVT Evaluation: No evidence of DVT seen on physical exam. Labs: Lab Results  Component Value Date   WBC 15.9 (H) 03/24/2019   HGB 8.7 (L) 03/24/2019   HCT 25.5 (L) 03/24/2019   MCV 77.5 (L) 03/24/2019   PLT 251 03/24/2019   CMP Latest Ref Rng & Units 03/23/2019  Glucose 65 - 99 mg/dL -  BUN 6 - 20 mg/dL -  Creatinine 0.44 - 1.00 mg/dL 0.46  Sodium 135 - 145 mmol/L -  Potassium 3.5 - 5.1 mmol/L -  Chloride 101 - 111 mmol/L -  CO2 22 - 32 mmol/L -  Calcium 8.9 - 10.3 mg/dL -  Total Protein 6.5 - 8.1 g/dL -  Total Bilirubin 0.3 - 1.2 mg/dL -  Alkaline Phos 38 - 126 U/L -  AST 15 - 41 U/L -  ALT 14 - 54 U/L -    Discharge instruction: per After Visit Summary and "  Baby and Me Booklet".  After visit meds:  Allergies as of 03/25/2019   No Known Allergies     Medication List    TAKE these medications   ferrous sulfate 325 (65 FE) MG tablet Take 1 tablet (325 mg total) by mouth 2 (two) times daily with a meal.   ibuprofen 800 MG tablet Commonly known as: ADVIL Take 1 tablet (800 mg total) by mouth every 8 (eight) hours as needed.   oxyCODONE 5 MG immediate release tablet Commonly known as: Oxy IR/ROXICODONE Take 1-2 tablets (5-10 mg total) by mouth every 4 (four) hours as needed for moderate pain.   Prenate Pixie 10-0.6-0.4-200 MG Caps Take 1 tablet by mouth daily.   senna-docusate 8.6-50 MG tablet Commonly known as: Senokot-S Take 2 tablets by mouth daily.       Diet: routine diet  Activity: Advance as tolerated. Pelvic rest for 6 weeks.   Outpatient follow up:4 weeks Follow up Appt: Future Appointments  Date Time Provider [place]  04/25/2019  1:55 PM Darlina Rumpf, CNM [place]   Follow up Visit: [place] [facility]. Go on 04/25/2019.   Specialty: Obstetrics and Gynecology Why: Postpartum visit with Mallie Snooks, CNM at 1:55 pm  Contact information: [address] [place], [place] [id number] [place] [id number] [phone]          Please schedule this patient for Postpartum visit in: 4 weeks with the following provider: MD For C/S patients schedule nurse incision check in weeks 2 weeks: yes Low risk pregnancy complicated by: history of Cesarean section, obesity Delivery mode:  CS Anticipated Birth Control:  Bilateral Salpingectomy PP Procedures needed: Incision check  Schedule Integrated BH visit: no  Newborn Data: Live born female  Birth Weight:  2790g APGAR: 82, 9  Newborn Delivery   Birth date/time: 03/23/2019 14:05:00 Delivery type: C-Section, Low Transverse Trial of labor: Yes C-section categorization: Repeat      Baby Feeding: Breast Disposition:home with mother   03/25/2019 Melina Schools, DO

## 2019-03-23 NOTE — Progress Notes (Signed)
Labor Progress Note Mareli Klutz is a 33 y.o. [id number] at [redacted]w[redacted]d presented for IOL for post-dates. Hx of C-section. S: Not very comfortable despite epidural.   O:  BP 99/76   Pulse (!) 111   Temp 97.6 F (36.4 C) (Oral)   Resp 17   Ht 5\' 3"  (1.6 m)   Wt 112.5 kg   LMP 06/12/2018   SpO2 98%   BMI 43.93 kg/m  EFM: 130, reactive, moderate variability, pos accels, prolonged decel after epidural and re-dosing with Fentanyl  Ctx: q72m  CVE: Dilation: 5.5 Effacement (%): 80 Cervical Position: Middle Station: -1 Presentation: Vertex Exam by:: Ola Spurr, RN   A&P: 33 y.o. I6O0321 [redacted]w[redacted]d here for IOL for post-dates. Hx of C-section. #Labor: VBAC consent 12/20/2018. AROM at 0445 with clear fluid. Pit turned off after prolonged decel, will restart at 6 milliunits. Repeat SVE in 2 hours or as clinically indicated.  #Pain:  IV pain meds; possibly epidural eventually  #FWB: Cat I/II; EFW: 3500g #ID:      GBS neg  Chauncey Mann, MD 6:04 AM

## 2019-03-23 NOTE — Progress Notes (Signed)
Called to bedside to evaluate epidural as patient continues to have patchy pain on right side with contractions. Pulled epidural catheter out 1cm and bolused with 79ml of 0.25% bupivacaine while in the right lateral decubitus position. Will continue to monitor pain control.

## 2019-03-23 NOTE — Lactation Note (Signed)
This note was copied from a baby's chart. Lactation Consultation Note  Patient Name: Kayla [name] [id number]'U Date: 03/23/2019 Reason for consult: Initial assessment;Mother's request;Term  LC in to visit with P2 Mom of 78 hr old term baby.  Mom requested help with breastfeeding from [facility].  LC was told to come back as Mom was wanting to eat her dinner. Baby swaddled and lying on Mom's bed, showing cues he could eat.  Offered to assist as reminded Mom that baby would most likely become sleepy. Mom wanting to eat instead.  Encouraged STS and breastfeeding with cues.  Lactation brochure left in room for Mom.  Reviewed IP and OP lactation support available to her.  Encouraged Mom to ask her RN for assistance prn.    Consult Status Consult Status: Follow-up Date: 03/24/19 Follow-up type: In-patient    Kayla [name] 03/23/2019, 6:40 PM

## 2019-03-23 NOTE — Lactation Note (Signed)
This note was copied from a baby's chart. Lactation Consultation Note  Patient Name: Kayla [name] FUXNA'T Date: 03/23/2019 Reason for consult: Follow-up assessment;Mother's request  Kayla [name] called for lactation support. When I arrived, she stated that she put baby to the breast for about 5 minutes prior to entry. Baby "Kayla [name]" was cueing upon entry. I observed Kayla [name] him and put him in football hold on her left breast. She hand expressed colostrum first, and attempted to latch. He opened his mouth, but he never fully grasped the breast. After a few attempts he went to sleep.  I noted that Kayla [name] has pendulous breasts with everted nipples that flatten with compression. She states that she primarily pumped with her first child, now 81.49 years old.  She plans to pump with this child as well once her milk transitions.  I brought her some size 30 flanges upon request and brought her a manual pump. I instructed her to breast feed 8-12 times a day, hand express and use the manual pump and feed back any EBM to baby.  I also informed staff RN regarding latch attempt. Baby was too sleepy to fully commit to latching, but mom may need a nipple shield tonight. RN verbalized understanding.    Maternal Data Formula Feeding for Exclusion: No Has patient been taught Hand Expression?: Yes Does the patient have breastfeeding experience prior to this delivery?: Yes  Feeding Feeding Type: Breast Fed  LATCH Score Latch: Too sleepy or reluctant, no latch achieved, no sucking elicited.  Audible Swallowing: None  Type of Nipple: Flat(flattens with compression)  Comfort (Breast/Nipple): Soft / non-tender  Hold (Positioning): No assistance needed to correctly position infant at breast.  LATCH Score: 5  Interventions Interventions: Breast feeding basics reviewed;Assisted with latch;Hand express;Support pillows;Hand pump  Lactation Tools Discussed/Used Tools: Other  (comment);Flanges(manual pump) Flange Size: 30   Consult Status Consult Status: Follow-up Date: 03/24/19 Follow-up type: In-patient    Lenore Manner 03/23/2019, 9:58 PM

## 2019-03-23 NOTE — Progress Notes (Signed)
Pt has been vocal about the frequent checks. This RN explained to pt the reasoning for the frequent checks post CS and pt responded " I'll call out if im in pain" " I need time to myself to get organized". Pt hasn't signed the safety admission sheet or consent for circumcision if she decides to have it done here. Pt is having second thoughts about circ being done here. Otherwise, pt can be very pleasant.

## 2019-03-24 ENCOUNTER — Encounter (HOSPITAL_COMMUNITY): Payer: Self-pay | Admitting: Family Medicine

## 2019-03-24 LAB — CBC
HCT: 25.5 % — ABNORMAL LOW (ref 36.0–46.0)
Hemoglobin: 8.7 g/dL — ABNORMAL LOW (ref 12.0–15.0)
MCH: 26.4 pg (ref 26.0–34.0)
MCHC: 34.1 g/dL (ref 30.0–36.0)
MCV: 77.5 fL — ABNORMAL LOW (ref 80.0–100.0)
Platelets: 251 10*3/uL (ref 150–400)
RBC: 3.29 MIL/uL — ABNORMAL LOW (ref 3.87–5.11)
RDW: 13.4 % (ref 11.5–15.5)
WBC: 15.9 10*3/uL — ABNORMAL HIGH (ref 4.0–10.5)
nRBC: 0 % (ref 0.0–0.2)

## 2019-03-24 MED ORDER — FERROUS SULFATE 325 (65 FE) MG PO TABS
325.0000 mg | ORAL_TABLET | Freq: Two times a day (BID) | ORAL | Status: DC
  Filled 2019-03-24: qty 1

## 2019-03-24 NOTE — Lactation Note (Signed)
This note was copied from a baby's chart. Lactation Consultation Note  Patient Name: Boy Cade Olberding [id number] Date: 03/24/2019 Reason for consult: Mother's request  [place] notified pt. Desired to see lactation.  When LC entered room mom was frustrated and c/o lc being too late bc infant had just fed for 10 minutes.  Infant completely dressed, LC offered to assist with bf now.  Mom agreed and infant attempted to latch but would loose latch after a couple of sucks then fall asleep.  Sucked finger well; lingual frenulum easily seen.  Mom states infant worked hard to get latched.  LC offered a different position for mom to try; cross cradle.  She usually tries football.  Infant slept.  [place] praised mom for recent successful feed. LC ofered to come back for next feed.    [place] listened to mom and concerns about amount of colostrum infant was receiving and offered to assist with hand exp. To provide more colostrum.  Mom declined.  When mom c/o infant struggling, LC discussed possibility of using a NS with feed.  Mom told [place] breastfeeding being pushed on moms and LC listened and let her know the staff supports her decision, whatever it may be, of how she chooses to feed infant.   She said she feels pressure is put on moms in general to bf.  Mom had questions about the bottle, and LC discussed pumping and providing ebm via bottle because mom said she may prefer that over putting infant to breast.    Mom requested to have pump in room along with shield, (she used this with her 2/5 yr.old,).  Mom declined when [facility] offered to set up pump.  LC also explained to mom the benefit of observing a feed with the NS to ensure proper use and fit.  Mom requested it be left in room.  Mom also asked for formula to be put in room so all of her options were available to her when he feeds again.    LC reported to RN the encounter and that pump kit was in room along with NS.  Also shared with RN moms desire to have formula in the room.   Mom asked LC if we had "earth's best " formula brand. [place]  Let her know hospital did not carry that brand.    All questions answered and encouraged mom to call out if she desired assistance with feed.      Maternal Data    Feeding Feeding Type: Breast Fed  LATCH Score Latch: Too sleepy or reluctant, no latch achieved, no sucking elicited.  Audible Swallowing: None  Type of Nipple: Flat(large, short shafted, thick nipples)  Comfort (Breast/Nipple): Soft / non-tender  Hold (Positioning): Assistance needed to correctly position infant at breast and maintain latch.  LATCH Score: 4  Interventions Interventions: Assisted with latch;Breast feeding basics reviewed;Support pillows  Lactation Tools Discussed/Used     Consult Status Consult Status: Follow-up Date: 03/25/19 Follow-up type: In-patient    Ferne Coe [facility] 03/24/2019, 6:40 PM

## 2019-03-24 NOTE — Progress Notes (Signed)
Subjective: Postpartum Day 1: Cesarean Delivery Patient reports tolerating PO, + flatus and no problems voiding.    Objective: Vital signs in last 24 hours: Temp:  [98 F (36.7 C)-98.6 F (37 C)] 98.6 F (37 C) (10/03 0548) Pulse Rate:  [96-110] 100 (10/03 0548) Resp:  [16-42] 18 (10/03 0548) BP: (102-165)/(66-85) 110/72 (10/03 0548) SpO2:  [97 %-100 %] 99 % (10/03 0548)  Physical Exam:  General: alert, cooperative and no distress Lochia: appropriate Uterine Fundus: firm Incision: healing well DVT Evaluation: No evidence of DVT seen on physical exam.  Recent Labs    03/23/19 1838 03/24/19 0955  HGB 11.4* 8.7*  HCT 31.7* 25.5*    Assessment/Plan: Status post Cesarean section. Doing well postoperatively.  Continue current care.  Marcille Buffy DNP, CNM  03/24/19  1:57 PM

## 2019-03-25 MED ORDER — IBUPROFEN 800 MG PO TABS: 800.0000 mg | ORAL_TABLET | Freq: Three times a day (TID) | ORAL | 1 refills | Status: AC | PRN

## 2019-03-25 MED ORDER — SENNOSIDES-DOCUSATE SODIUM 8.6-50 MG PO TABS: 2.0000 | ORAL_TABLET | ORAL | 0 refills | Status: AC

## 2019-03-25 MED ORDER — FERROUS SULFATE 325 (65 FE) MG PO TABS: 325.0000 mg | ORAL_TABLET | Freq: Two times a day (BID) | ORAL | 3 refills | Status: AC

## 2019-03-25 MED ORDER — OXYCODONE HCL 5 MG PO TABS: 5.0000 mg | ORAL_TABLET | ORAL | 0 refills | Status: AC | PRN

## 2019-03-25 NOTE — Discharge Instructions (Signed)
Postpartum Care After Cesarean Delivery °This sheet gives you information about how to care for yourself from the time you deliver your baby to up to 6-12 weeks after delivery (postpartum period). Your health care provider may also give you more specific instructions. If you have problems or questions, contact your health care provider. °Follow these instructions at home: °Medicines °· Take over-the-counter and prescription medicines only as told by your health care provider. °· If you were prescribed an antibiotic medicine, take it as told by your health care provider. Do not stop taking the antibiotic even if you start to feel better. °· Ask your health care provider if the medicine prescribed to you: °? Requires you to avoid driving or using heavy machinery. °? Can cause constipation. You may need to take actions to prevent or treat constipation, such as: °§ Drink enough fluid to keep your urine pale yellow. °§ Take over-the-counter or prescription medicines. °§ Eat foods that are high in fiber, such as beans, whole grains, and fresh fruits and vegetables. °§ Limit foods that are high in fat and processed sugars, such as fried or sweet foods. °Activity °· Gradually return to your normal activities as told by your health care provider. °· Avoid activities that take a lot of effort and energy (are strenuous) until approved by your health care provider. Walking at a slow to moderate pace is usually safe. Ask your health care provider what activities are safe for you. °? Do not lift anything that is heavier than your baby or 10 lb (4.5 kg) as told by your health care provider. °? Do not vacuum, climb stairs, or drive a car for as long as told by your health care provider. °· If possible, have someone help you at home until you are able to do your usual activities yourself. °· Rest as much as possible. Try to rest or take naps while your baby is sleeping. °Vaginal bleeding °· It is normal to have vaginal bleeding  (lochia) after delivery. Wear a sanitary pad to absorb vaginal bleeding and discharge. °? During the first week after delivery, the amount and appearance of lochia is often similar to a menstrual period. °? Over the next few weeks, it will gradually decrease to a dry, yellow-brown discharge. °? For most women, lochia stops completely by 4-6 weeks after delivery. Vaginal bleeding can vary from woman to woman. °· Change your sanitary pads frequently. Watch for any changes in your flow, such as: °? A sudden increase in volume. °? A change in color. °? Large blood clots. °· If you pass a blood clot, save it and call your health care provider to discuss. Do not flush blood clots down the toilet before you get instructions from your health care provider. °· Do not use tampons or douches until your health care provider says this is safe. °· If you are not breastfeeding, your period should return 6-8 weeks after delivery. If you are breastfeeding, your period may return anytime between 8 weeks after delivery and the time that you stop breastfeeding. °Perineal care ° °· If your C-section (Cesarean section) was unplanned, and you were allowed to labor and push before delivery, you may have pain, swelling, and discomfort of the tissue between your vaginal opening and your anus (perineum). You may also have an incision in the tissue (episiotomy) or the tissue may have torn during delivery. Follow these instructions as told by your health care provider: °? Keep your perineum clean and dry as told by   your health care provider. Use medicated pads and pain-relieving sprays and creams as directed. °? If you have an episiotomy or vaginal tear, check the area every day for signs of infection. Check for: °§ Redness, swelling, or pain. °§ Fluid or blood. °§ Warmth. °§ Pus or a bad smell. °? You may be given a squirt bottle to use instead of wiping to clean the perineum area after you go to the bathroom. As you start healing, you may use  the squirt bottle before wiping yourself. Make sure to wipe gently. °? To relieve pain caused by an episiotomy, vaginal tear, or hemorrhoids, try taking a warm sitz bath 2-3 times a day. A sitz bath is a warm water bath that is taken while you are sitting down. The water should only come up to your hips and should cover your buttocks. °Breast care °· Within the first few days after delivery, your breasts may feel heavy, full, and uncomfortable (breast engorgement). You may also have milk leaking from your breasts. Your health care provider can suggest ways to help relieve breast discomfort. Breast engorgement should go away within a few days. °· If you are breastfeeding: °? Wear a bra that supports your breasts and fits you well. °? Keep your nipples clean and dry. Apply creams and ointments as told by your health care provider. °? You may need to use breast pads to absorb milk leakage. °? You may have uterine contractions every time you breastfeed for several weeks after delivery. Uterine contractions help your uterus return to its normal size. °? If you have any problems with breastfeeding, work with your health care provider or a lactation consultant. °· If you are not breastfeeding: °? Avoid touching your breasts as this can make your breasts produce more milk. °? Wear a well-fitting bra and use cold packs to help with swelling. °? Do not squeeze out (express) milk. This causes you to make more milk. °Intimacy and sexuality °· Ask your health care provider when you can engage in sexual activity. This may depend on your: °? Risk of infection. °? Healing rate. °? Comfort and desire to engage in sexual activity. °· You are able to get pregnant after delivery, even if you have not had your period. If desired, talk with your health care provider about methods of family planning or birth control (contraception). °Lifestyle °· Do not use any products that contain nicotine or tobacco, such as cigarettes, e-cigarettes,  and chewing tobacco. If you need help quitting, ask your health care provider. °· Do not drink alcohol, especially if you are breastfeeding. °Eating and drinking ° °· Drink enough fluid to keep your urine pale yellow. °· Eat high-fiber foods every day. These may help prevent or relieve constipation. High-fiber foods include: °? Whole grain cereals and breads. °? Brown rice. °? Beans. °? Fresh fruits and vegetables. °· Take your prenatal vitamins until your postpartum checkup or until your health care provider tells you it is okay to stop. °General instructions °· Keep all follow-up visits for you and your baby as told by your health care provider. Most women visit their health care provider for a postpartum checkup within the first 3-6 weeks after delivery. °Contact a health care provider if you: °· Feel unable to cope with the changes that a new baby brings to your life, and these feelings do not go away. °· Feel unusually sad or worried. °· Have breasts that are painful, hard, or turn red. °· Have a fever. °·   Have trouble holding urine or keeping urine from leaking.  Have little or no interest in activities you used to enjoy.  Have not breastfed at all and you have not had a menstrual period for 12 weeks after delivery.  Have stopped breastfeeding and you have not had a menstrual period for 12 weeks after you stopped breastfeeding.  Have questions about caring for yourself or your baby.  Pass a blood clot from your vagina. Get help right away if you:  Have chest pain.  Have difficulty breathing.  Have sudden, severe leg pain.  Have severe pain or cramping in your abdomen.  Bleed from your vagina so much that you fill more than one sanitary pad in one hour. Bleeding should not be heavier than your heaviest period.  Develop a severe headache.  Faint.  Have blurred vision or spots in your vision.  Have a bad-smelling vaginal discharge.  Have thoughts about hurting yourself or your  baby. If you ever feel like you may hurt yourself or others, or have thoughts about taking your own life, get help right away. You can go to your nearest emergency department or call:  Your local emergency services (911 in the [place]  A suicide crisis helpline, such as the [place] at 463-723-4049. This is open 24 hours a day. Summary  The period of time from when you deliver your baby to up to 6-12 weeks after delivery is called the postpartum period.  Gradually return to your normal activities as told by your health care provider.  Keep all follow-up visits for you and your baby as told by your health care provider. This information is not intended to replace advice given to you by your health care provider. Make sure you discuss any questions you have with your health care provider. Document Released: 06/04/2000 Document Revised: 01/25/2018 Document Reviewed: 01/25/2018 Elsevier Patient Education  2020 [place] to have your son circumcised:                                                                      [facility]     [phone]   [phone] while you are in hospital         [facility]              [phone]   $269 by 4 wks                      [facility]                     [phone]   $269 by 7 days [facility]                    [phone]   $269 by 4 wks [facility]             [phone]   $225 by 2 wks    These prices sometimes change but are roughly what you can expect to pay. Please call and confirm pricing.   Circumcision is considered an elective/non-medically necessary procedure. There are many reasons parents decide to have their sons circumsized. During the first year of life circumcised males have a reduced risk  of urinary tract infections but after this year the rates between circumcised males and uncircumcised males are the same.  It is safe to have your son circumcised outside of the hospital and the places above perform them regularly.    Deciding about Circumcision in Baby Boys  (Up-to-date The Basics)  What is circumcision?   Circumcision is a surgery that removes the skin that covers the tip of the penis, called the "foreskin" Circumcision is usually done when a boy is between 53 and 52 days old. In the [place], circumcision is common. In some other countries, fewer boys are circumcised. Circumcision is a common tradition in some religions.  Should I have my baby boy circumcised?   There is no easy answer. Circumcision has some benefits. But it also has risks. After talking with your doctor, you will have to decide for yourself what is right for your family.  What are the benefits of circumcision?   Circumcised boys seem to have slightly lower rates of: ?Urinary tract infections ?Swelling of the opening at the tip of the penis Circumcised men seem to have slightly lower rates of: ?Urinary tract infections ?Swelling of the opening at the tip of the penis ?Penis cancer ?HIV and other infections that you catch during sex ?Cervical cancer in the women they have sex with Even so, in the [place], the risks of these problems are small - even in boys and men who have not been circumcised. Plus, boys and men who are not circumcised can reduce these extra risks by: ?Cleaning their penis well ?Using condoms during sex  What are the risks of circumcision?  Risks include: ?Bleeding or infection from the surgery ?Damage to or amputation of the penis ?A chance that the doctor will cut off too much or not enough of the foreskin ?A chance that sex won't feel as good later in life Only about 1 out of every 200 circumcisions leads to problems. There is also a chance that your health insurance won't pay for circumcision.  How is circumcision done in baby boys?  First, the baby gets medicine for pain relief. This might be a cream on the skin or a shot into the base of the penis. Next, the doctor cleans the baby's  penis well. Then he or she uses special tools to cut off the foreskin. Finally, the doctor wraps a bandage (called gauze) around the baby's penis. If you have your baby circumcised, his doctor or nurse will give you instructions on how to care for him after the surgery. It is important that you follow those instructions carefully.

## 2019-03-25 NOTE — Lactation Note (Signed)
This note was copied from a baby's chart. Lactation Consultation Note  Patient Name: Kayla [name] Date: 03/25/2019 Reason for consult: Follow-up assessment;Infant weight loss;Other (Comment);Term(6 % weight loss / for Early D/C) Baby is 77 hours old  Baby sound asleep when LC entered the room .  Per mom the baby recently fed 1200 for 12 mins.  Per mom breastfeeding is going well.  Mom denies soreness , sore nipple and engorgement prevention and tx  Reviewed. Per mom has a pump at home. Storage of breast milk reviewed from page 36 in the Mother and Baby care booklet.  Mom aware of the [facility] resources after D/C for [facility].   Maternal Data    Feeding Feeding Type: (pe rmom the baby last fed at 1200 for 12 mins with swallows)  LATCH Score                   Interventions Interventions: Breast feeding basics reviewed  Lactation Tools Discussed/Used Pump Review: Milk Storage(MAI reviewed 10/4)   Consult Status Consult Status: Complete Date: 03/25/19    Jerlyn Ly Fanchon [place] 03/25/2019, 1:33 PM

## 2019-03-26 ENCOUNTER — Encounter (HOSPITAL_COMMUNITY): Payer: Self-pay | Admitting: *Deleted

## 2019-03-26 LAB — SURGICAL PATHOLOGY

## 2019-04-06 ENCOUNTER — Ambulatory Visit (INDEPENDENT_AMBULATORY_CARE_PROVIDER_SITE_OTHER): Payer: Medicaid Other

## 2019-04-06 ENCOUNTER — Other Ambulatory Visit: Payer: Self-pay

## 2019-04-06 VITALS — BP 139/82 | HR 72 | Wt 240.1 lb

## 2019-04-06 DIAGNOSIS — Z5189 Encounter for other specified aftercare: Secondary | ICD-10-CM

## 2019-04-06 NOTE — Progress Notes (Signed)
Pt here today for incision check s/p c-section on 03/23/19.  Incision well approximated, no drainage, no odor, and no erythema. No c/o pain.  Pt advised to continue to monitor for signs of infection.  Pt has pp visit scheduled for 04/25/19.  Pt verbalized understanding with no further questions.   Mel Almond, RN  04/06/19

## 2019-04-06 NOTE — Progress Notes (Signed)
Patient seen and assessed by nursing staff during this encounter. I have reviewed the chart and agree with the documentation and plan.  Kelvis Berger, MD 04/06/2019 12:25 PM    

## 2019-04-25 ENCOUNTER — Encounter: Payer: Self-pay | Admitting: Advanced Practice Midwife

## 2019-04-25 ENCOUNTER — Other Ambulatory Visit: Payer: Self-pay

## 2019-04-25 ENCOUNTER — Ambulatory Visit (INDEPENDENT_AMBULATORY_CARE_PROVIDER_SITE_OTHER): Payer: Medicaid Other | Admitting: Advanced Practice Midwife

## 2019-04-25 DIAGNOSIS — Z1389 Encounter for screening for other disorder: Secondary | ICD-10-CM | POA: Diagnosis not present

## 2019-04-25 DIAGNOSIS — Z9079 Acquired absence of other genital organ(s): Secondary | ICD-10-CM

## 2019-04-25 NOTE — Patient Instructions (Signed)
Preventive Care 21-31 Years Old, Female Preventive care refers to visits with your health care provider and lifestyle choices that can promote health and wellness. This includes:  A yearly physical exam. This may also be called an annual well check.  Regular dental visits and eye exams.  Immunizations.  Screening for certain conditions.  Healthy lifestyle choices, such as eating a healthy diet, getting regular exercise, not using drugs or products that contain nicotine and tobacco, and limiting alcohol use. What can I expect for my preventive care visit? Physical exam Your health care provider will check your:  Height and weight. This may be used to calculate body mass index (BMI), which tells if you are at a healthy weight.  Heart rate and blood pressure.  Skin for abnormal spots. Counseling Your health care provider may ask you questions about your:  Alcohol, tobacco, and drug use.  Emotional well-being.  Home and relationship well-being.  Sexual activity.  Eating habits.  Work and work environment.  Method of birth control.  Menstrual cycle.  Pregnancy history. What immunizations do I need?  Influenza (flu) vaccine  This is recommended every year. Tetanus, diphtheria, and pertussis (Tdap) vaccine  You may need a Td booster every 10 years. Varicella (chickenpox) vaccine  You may need this if you have not been vaccinated. Human papillomavirus (HPV) vaccine  If recommended by your health care provider, you may need three doses over 6 months. Measles, mumps, and rubella (MMR) vaccine  You may need at least one dose of MMR. You may also need a second dose. Meningococcal conjugate (MenACWY) vaccine  One dose is recommended if you are age 19-21 years and a first-year college student living in a residence hall, or if you have one of several medical conditions. You may also need additional booster doses. Pneumococcal conjugate (PCV13) vaccine  You may need  this if you have certain conditions and were not previously vaccinated. Pneumococcal polysaccharide (PPSV23) vaccine  You may need one or two doses if you smoke cigarettes or if you have certain conditions. Hepatitis A vaccine  You may need this if you have certain conditions or if you travel or work in places where you may be exposed to hepatitis A. Hepatitis B vaccine  You may need this if you have certain conditions or if you travel or work in places where you may be exposed to hepatitis B. Haemophilus influenzae type b (Hib) vaccine  You may need this if you have certain conditions. You may receive vaccines as individual doses or as more than one vaccine together in one shot (combination vaccines). Talk with your health care provider about the risks and benefits of combination vaccines. What tests do I need?  Blood tests  Lipid and cholesterol levels. These may be checked every 5 years starting at age 20.  Hepatitis C test.  Hepatitis B test. Screening  Diabetes screening. This is done by checking your blood sugar (glucose) after you have not eaten for a while (fasting).  Sexually transmitted disease (STD) testing.  BRCA-related cancer screening. This may be done if you have a family history of breast, ovarian, tubal, or peritoneal cancers.  Pelvic exam and Pap test. This may be done every 3 years starting at age 21. Starting at age 30, this may be done every 5 years if you have a Pap test in combination with an HPV test. Talk with your health care provider about your test results, treatment options, and if necessary, the need for more tests.   Follow these instructions at home: Eating and drinking   Eat a diet that includes fresh fruits and vegetables, whole grains, lean protein, and low-fat dairy.  Take vitamin and mineral supplements as recommended by your health care provider.  Do not drink alcohol if: ? Your health care provider tells you not to drink. ? You are  pregnant, may be pregnant, or are planning to become pregnant.  If you drink alcohol: ? Limit how much you have to 0-1 drink a day. ? Be aware of how much alcohol is in your drink. In the [place] one drink equals one 12 oz bottle of beer (355 mL), one 5 oz glass of wine (148 mL), or one 1 oz glass of hard liquor (44 mL). Lifestyle  Take daily care of your teeth and gums.  Stay active. Exercise for at least 30 minutes on 5 or more days each week.  Do not use any products that contain nicotine or tobacco, such as cigarettes, e-cigarettes, and chewing tobacco. If you need help quitting, ask your health care provider.  If you are sexually active, practice safe sex. Use a condom or other form of birth control (contraception) in order to prevent pregnancy and STIs (sexually transmitted infections). If you plan to become pregnant, see your health care provider for a preconception visit. What's next?  Visit your health care provider once a year for a well check visit.  Ask your health care provider how often you should have your eyes and teeth checked.  Stay up to date on all vaccines. This information is not intended to replace advice given to you by your health care provider. Make sure you discuss any questions you have with your health care provider. Document Released: 08/03/2001 Document Revised: 02/16/2018 Document Reviewed: 02/16/2018 Elsevier Patient Education  2020 [employer]  

## 2019-04-25 NOTE — Progress Notes (Signed)
Subjective:     Kayla [name] is a 33 y.o. female who presents for a postpartum visit. She is 4 weeks postpartum following a low cervical transverse Cesarean section. I have fully reviewed the prenatal and intrapartum course. The delivery was at 40 gestational weeks. Outcome: repeat cesarean section, low transverse incision. Anesthesia: spinal. Postpartum course has been uncomplicated. Baby's course has been uncomplicated. Baby is feeding by bottle - [employer] Good Start. Bleeding no bleeding. Bowel function is normal. Bladder function is normal. Patient is sexually active. Contraception method is tubal ligation. Postpartum depression screening: negative.  The following portions of the patient's history were reviewed and updated as appropriate: allergies, current medications, past family history, past medical history, past social history, past surgical history and problem list.  Review of Systems Pertinent items noted in HPI and remainder of comprehensive ROS otherwise negative.   Objective:    LMP 06/12/2018   General:  alert, cooperative and appears stated age  Lungs: clear to auscultation bilaterally  Heart:  regular rate and rhythm, S1, S2 normal, no murmur, click, rub or gallop  Abdomen: soft, non-tender; bowel sounds normal; no masses,  no organomegaly LTCS incision is well approximated, no drainage,  erythema or ecchymosis   Pelvic Exam:  Deferred        Assessment:    Normal postpartum exam. Pap smear not done at today's visit.   Plan:   1. Contraception: salpingectomy 2. Follow up: 12/2019 for well woman with pap smear  Mallie Snooks, MSN, CNM Certified Nurse Midwife, [facility] for [facility], [place] 04/25/19 2:24 PM

## 2019-05-03 ENCOUNTER — Encounter: Payer: Self-pay | Admitting: Obstetrics and Gynecology

## 2019-05-08 NOTE — Telephone Encounter (Signed)
Kayla [name],  Please Call.

## 2019-06-11 ENCOUNTER — Other Ambulatory Visit: Payer: Medicaid Other

## 2019-11-29 ENCOUNTER — Encounter: Payer: Self-pay | Admitting: Medical

## 2020-12-29 IMAGING — US US OB < 14 WEEKS - US OB TV
1 series · 15 of 28 positions shown · non-contrast
Comparison: None for this gestation

CLINICAL DATA: Abdominal pain in first trimester of pregnancy; EGA

EXAM:
OBSTETRIC <14 WK US AND TRANSVAGINAL OB US
TECHNIQUE: Both transabdominal and transvaginal ultrasound examinations were
performed for complete evaluation of the gestation as well as the
maternal uterus, adnexal regions, and pelvic cul-de-sac.
Transvaginal technique was performed to assess early pregnancy.

[Series 1: us ob < 14 weeks - us ob tv · 15 of 56 slices shown]
[im 1/56]
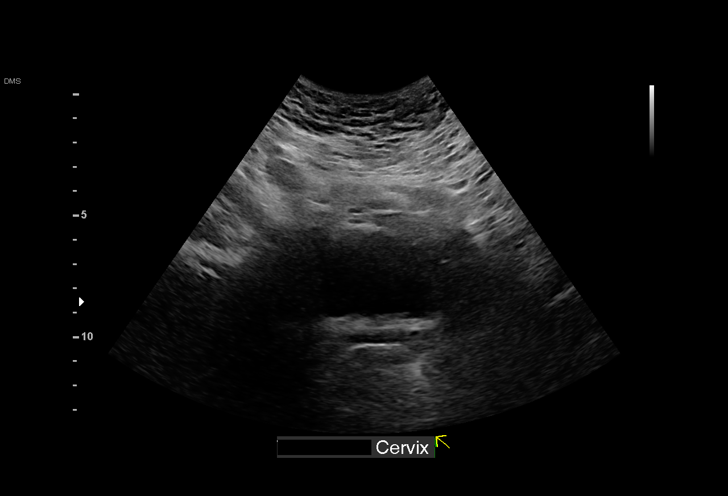
[im 5/56]
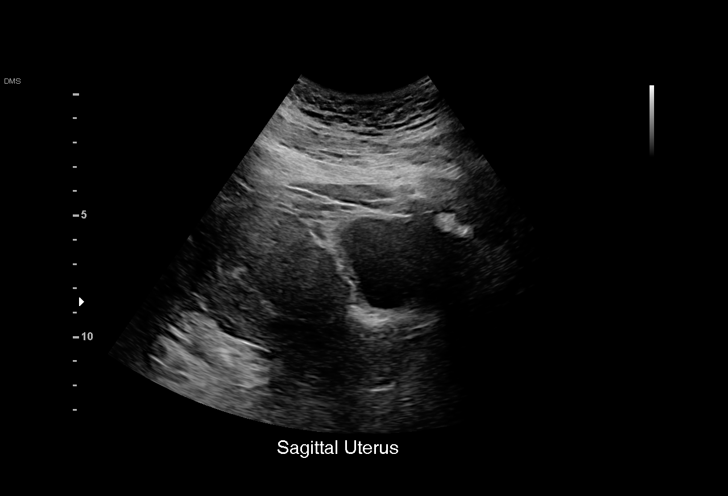
[im 9/56]
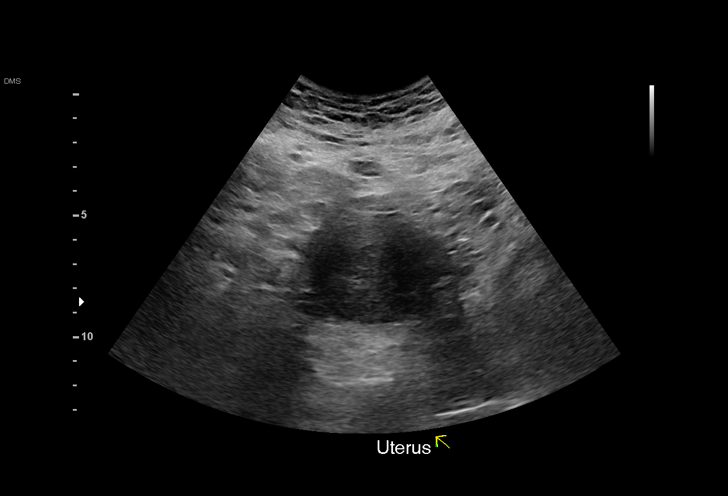
[im 13/56]
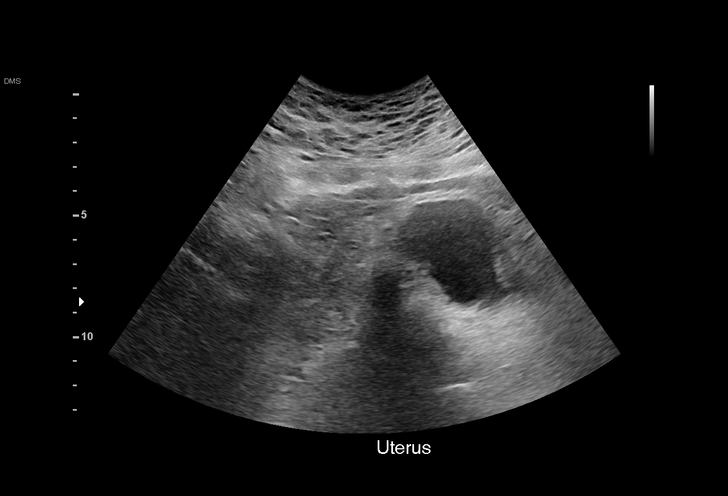
[im 17/56]
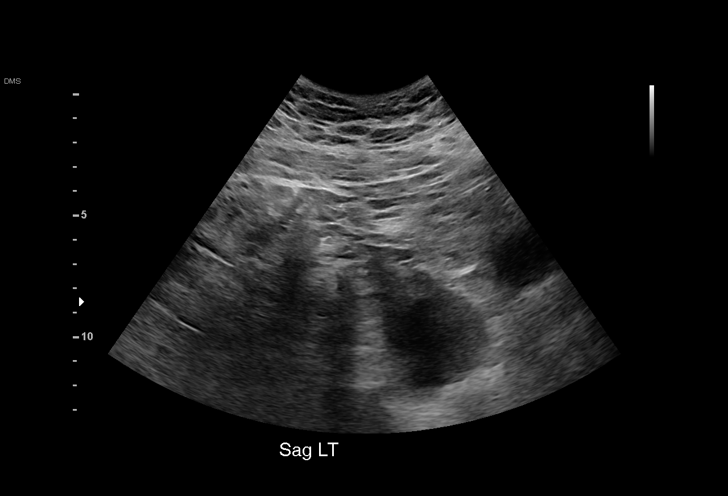
[im 21/56]
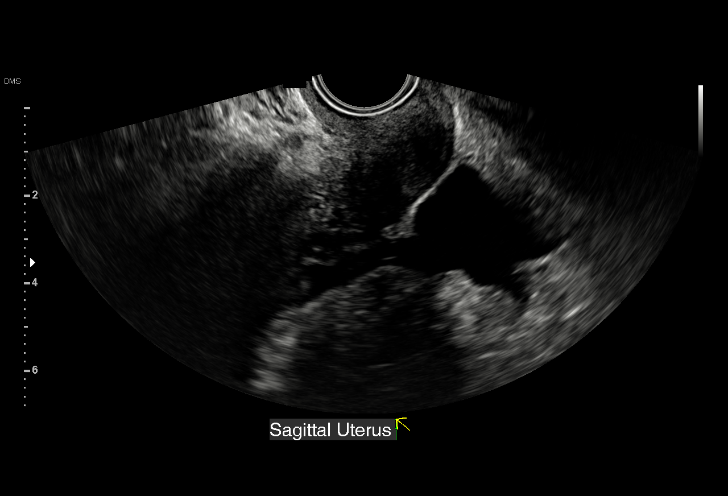
[im 25/56]
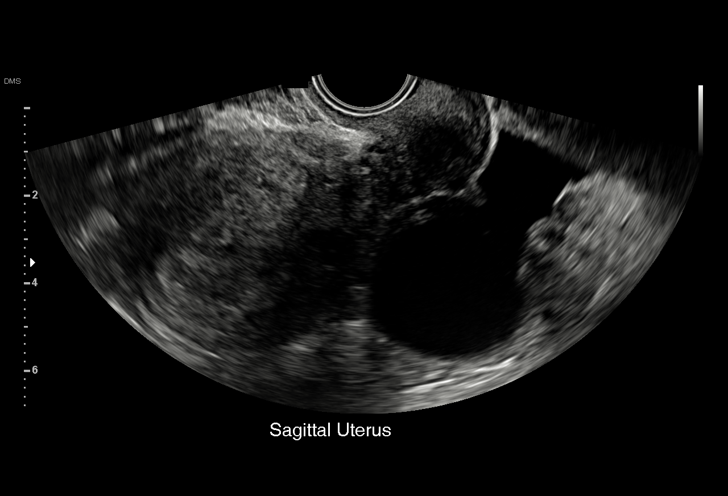
[im 29/56]
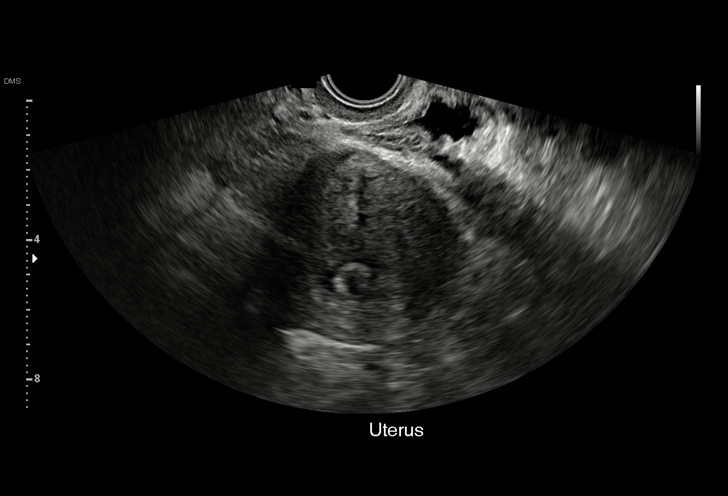
[im 31/56]
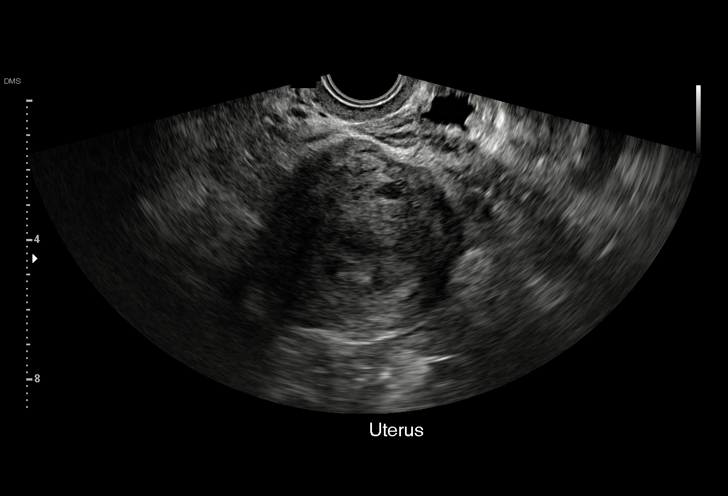
[im 35/56]
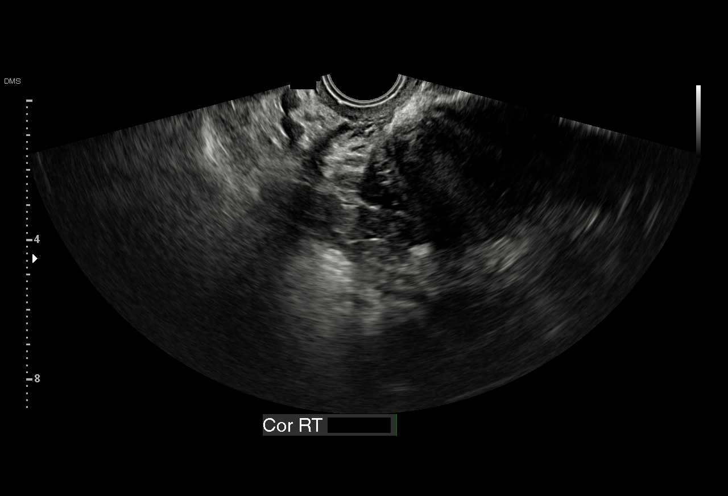
[im 39/56]
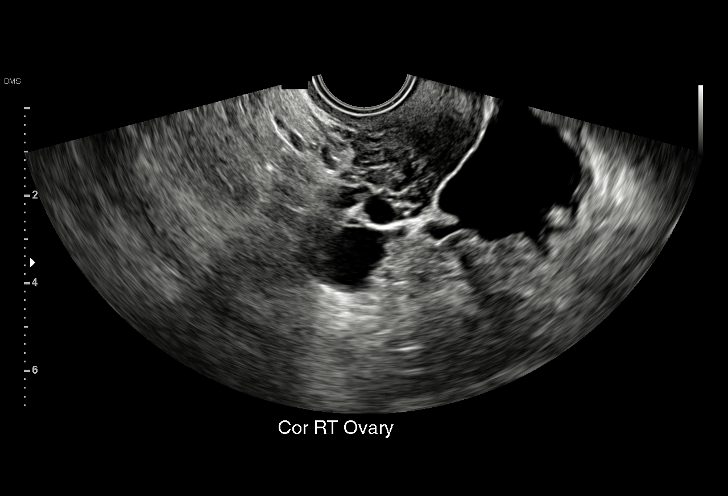
[im 43/56]
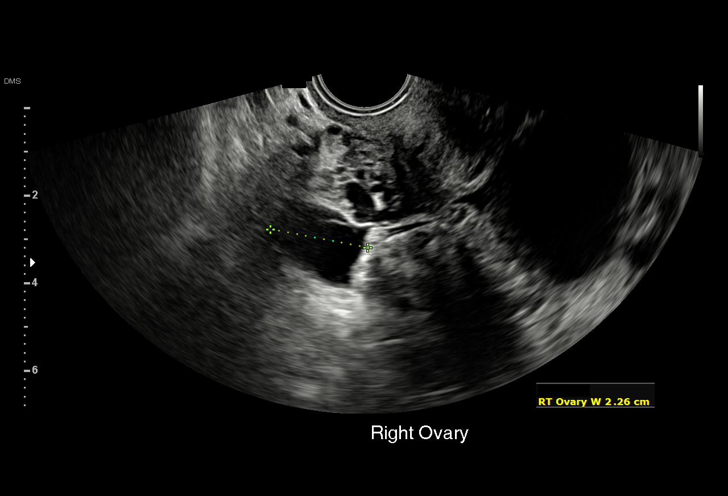
[im 47/56]
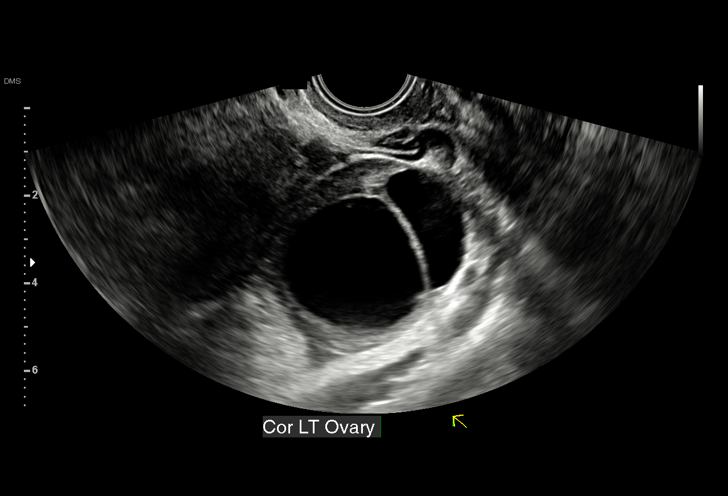
[im 51/56]
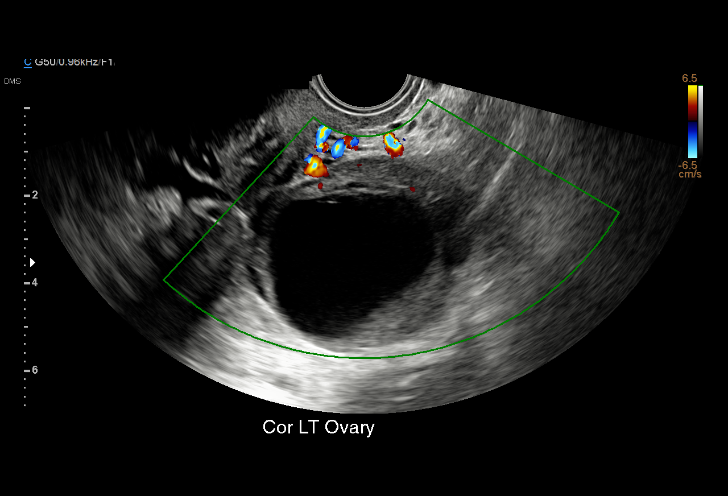
[im 56/56]
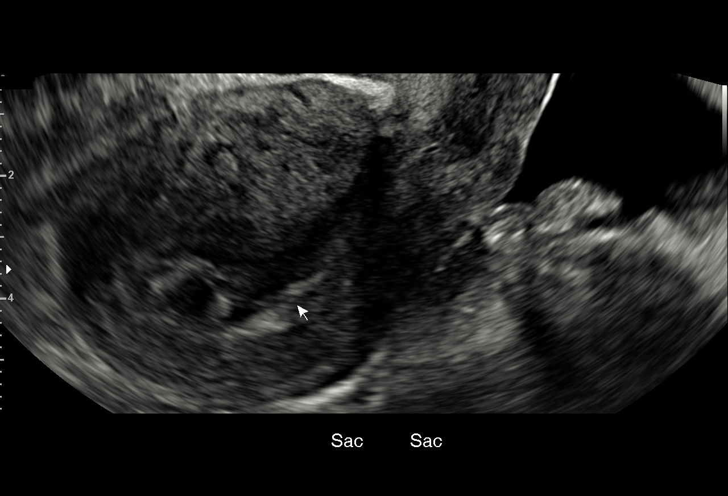

[15 of 28 positions shown; findings below may reference images not displayed]

FINDINGS: Intrauterine gestational sac: Present, single

Yolk sac:  Probably present

Embryo:  Not visualized

Cardiac Activity: N/A

Heart Rate: N/A  bpm

MSD: 7.1 mm   5 w   3 d

Subchorionic hemorrhage:  Small subchronic hemorrhage

Maternal uterus/adnexae:

RIGHT ovary measures 2.2 x 1.7 x 2.3 cm and contains a small corpus
luteal cyst.

LEFT ovary enlarged at 5.0 x 4.1 x 5.3 cm containing a complicated
cyst 3.8 x 3.5 x 3.6 cm which contains a septation as well as
dependent debris versus hemorrhage.

Moderate amount of free pelvic fluid.  No other adnexal masses.
IMPRESSION: Gestational sac within uterus containing a probable yolk sac but no
fetal pole identified to establish viability.

Small subchronic hemorrhage.

Complicated cyst LEFT ovary 3.8 x 3.5 x 3.6 cm containing a
septation as well as dependent debris versus hemorrhage, question
complex hemorrhagic cyst; recommend reassessment at time of
follow-up obstetrical imaging.

## 2021-05-04 IMAGING — US US MFM OB FOLLOW UP
1 series · 13 of 28 positions shown · non-contrast
Comparison: none

[Series 1: us mfm ob follow up · 13 of 67 slices shown]
[im 3/67]
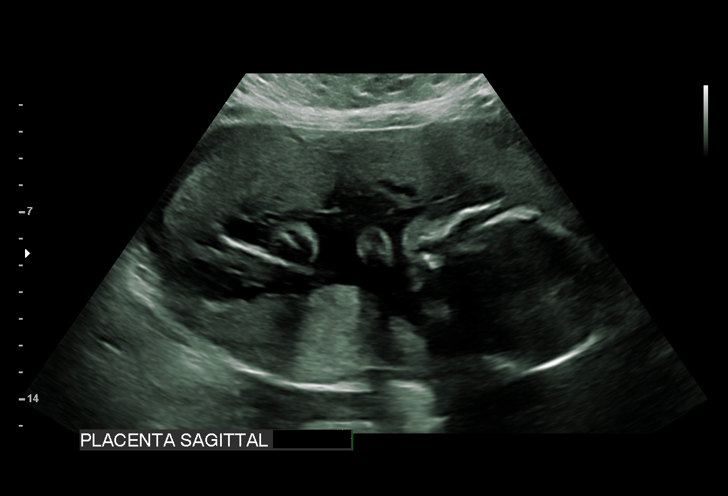
[im 8/67]
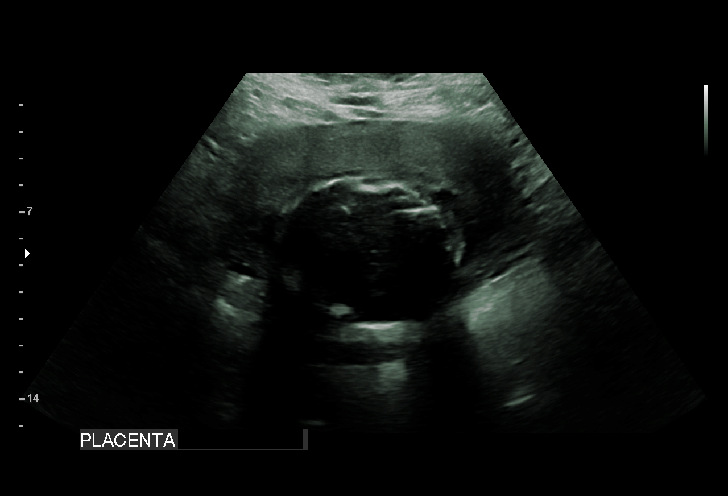
[im 13/67]
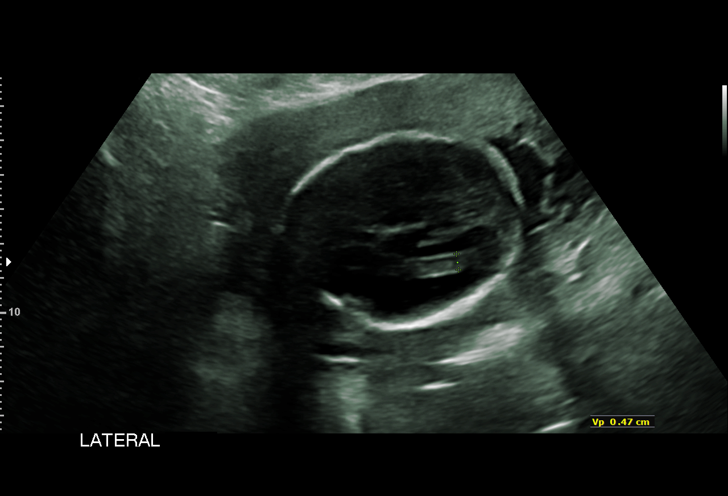
[im 18/67]
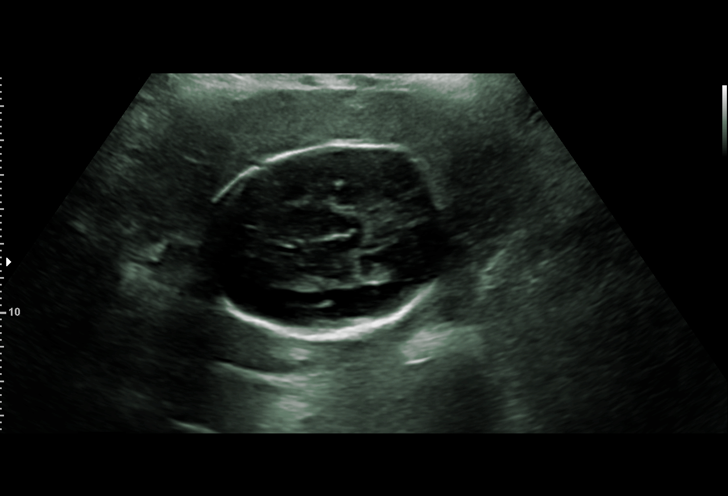
[im 23/67]
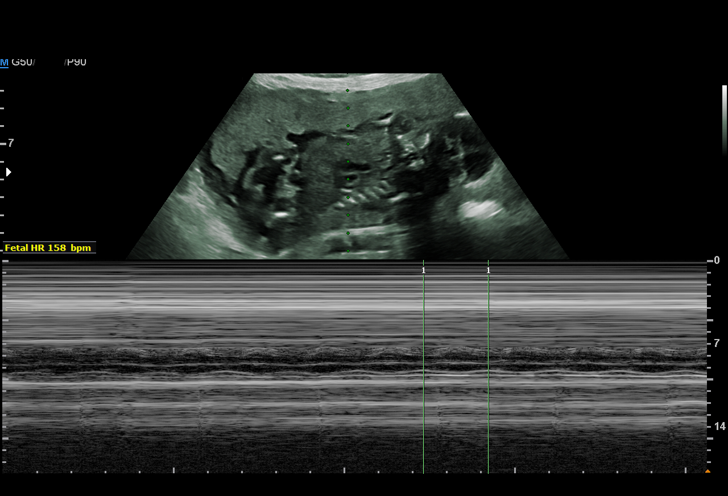
[im 27/67]
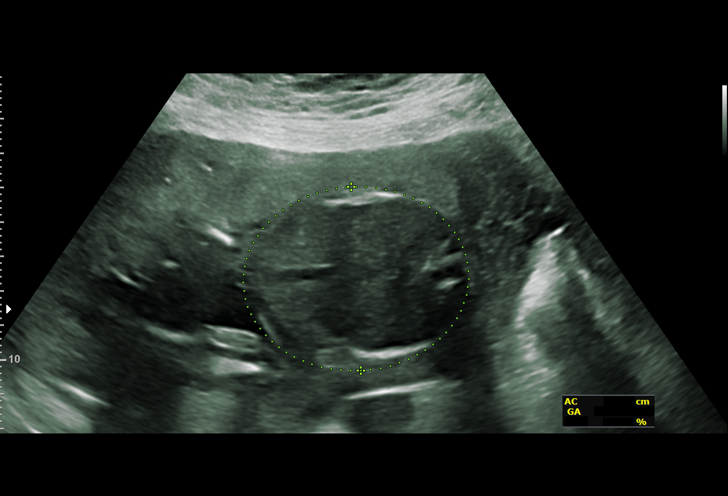
[im 35/67]
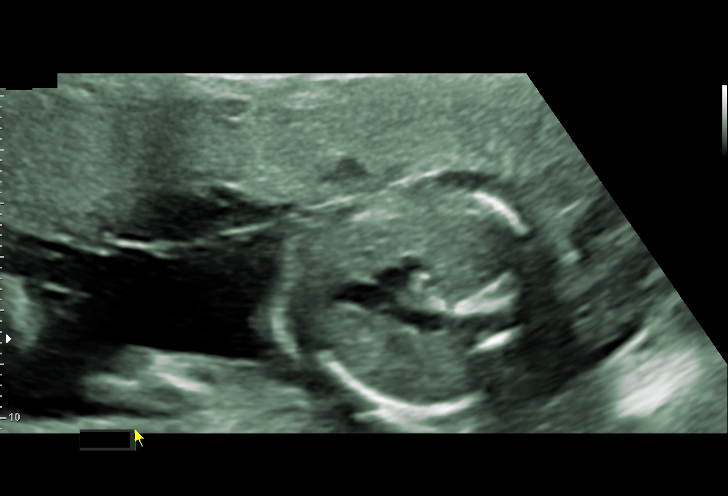
[im 40/67]
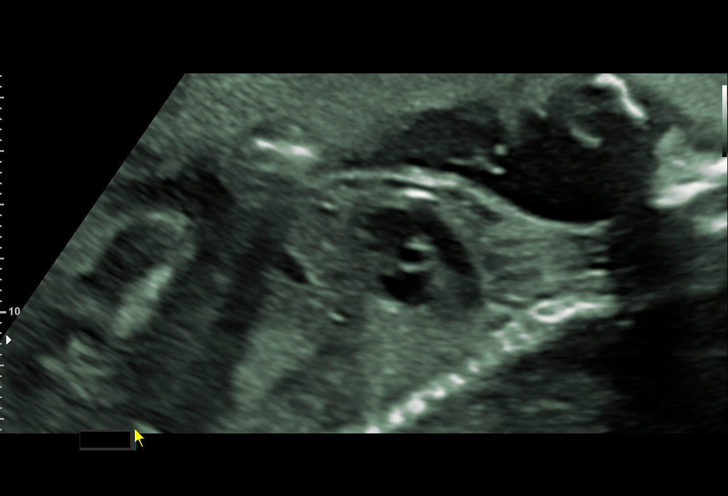
[im 45/67]
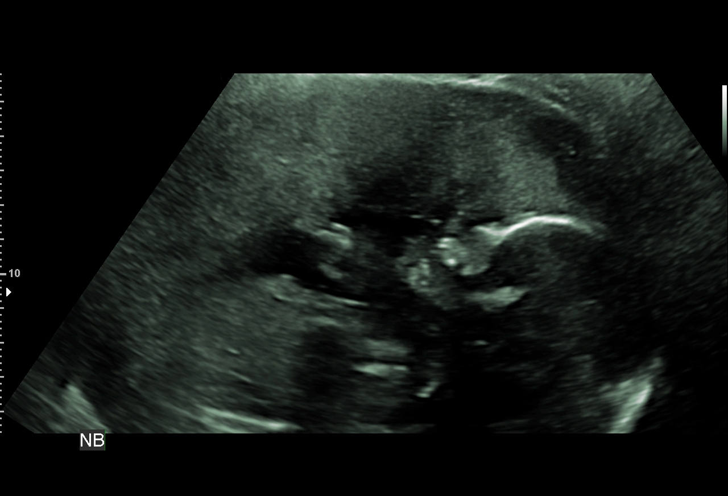
[im 49/67]
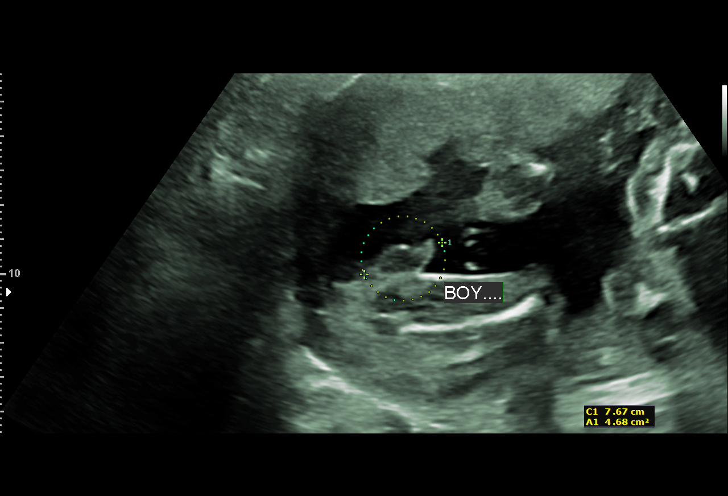
[im 54/67]
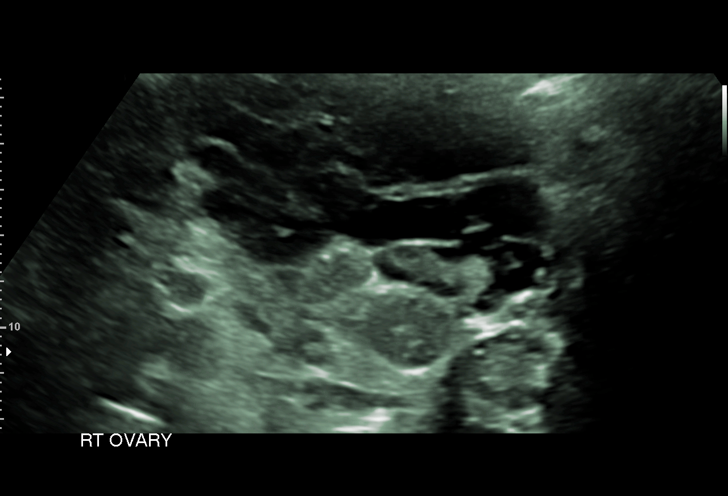
[im 59/67]
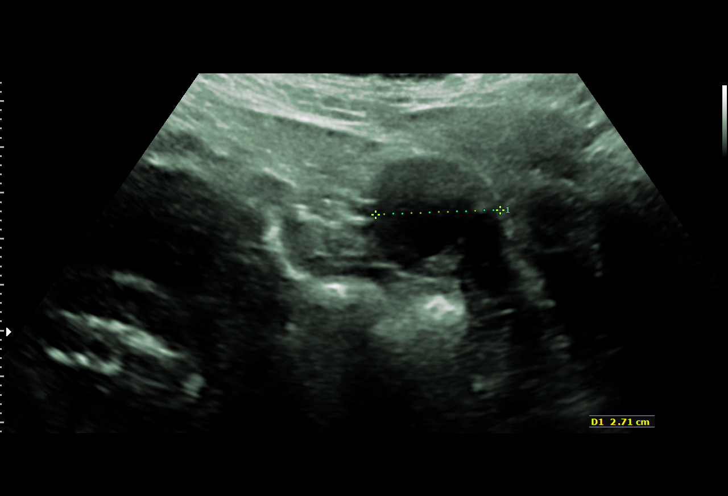
[im 64/67]
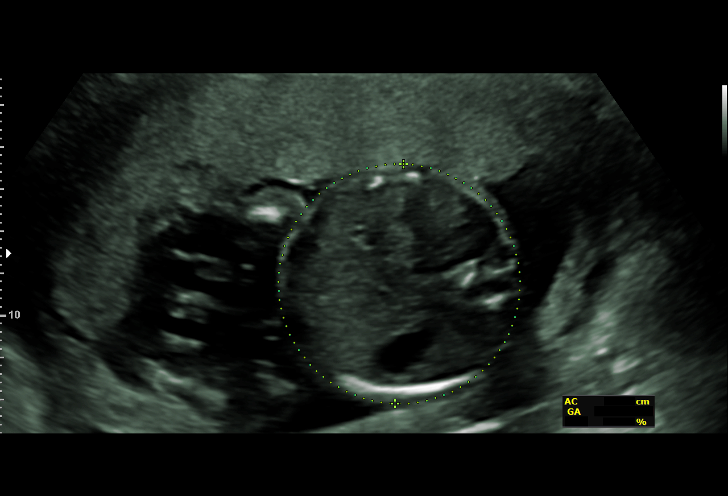

[13 of 28 positions shown; findings below may reference images not displayed]

[address]

 ----------------------------------------------------------------------

 ----------------------------------------------------------------------
Indications

  Previous cesarean delivery, antepartum
  23 weeks gestation of pregnancy
  Antenatal follow-up for nonvisualized fetal
  anatomy
  Obesity complicating pregnancy, second
  trimester
 ----------------------------------------------------------------------
Fetal Evaluation

 Num Of Fetuses:          1
 Fetal Heart Rate(bpm):   158
 Cardiac Activity:        Observed
 Presentation:            Cephalic
 Placenta:                Anterior

 Amniotic Fluid
 AFI FV:      Within normal limits

                             Largest Pocket(cm)

Biometry

 BPD:      53.9  mm     G. Age:  22w 3d         15  %    CI:        69.55   %    70 - 86
                                                         FL/HC:       20.4  %    19.2 -
 HC:      206.3  mm     G. Age:  22w 5d         17  %    HC/AC:       1.10       1.05 -
 AC:      187.4  mm     G. Age:  23w 4d         49  %    FL/BPD:      77.9  %    71 - 87
 FL:         42  mm     G. Age:  23w 5d         52  %    FL/AC:       22.4  %    20 - 24
 HUM:      40.8  mm     G. Age:  24w 5d         75  %
 CER:      25.6  mm     G. Age:  23w 4d         54  %
 LV:        4.7  mm
 CM:        6.7  mm

 Est. FW:     595   gm     1 lb 5 oz     55  %
OB History

 Gravidity:    2
 Living:       1
Gestational Age

 LMP:           23w 2d        Date:  06/12/18                 EDD:   03/19/19
 U/S Today:     23w 1d                                        EDD:   03/20/19
 Best:          23w 2d     Det. By:  LMP  (06/12/18)          EDD:   03/19/19
Anatomy

 Cranium:               Appears normal         LVOT:                   Appears normal
 Cavum:                 Appears normal         Aortic Arch:            Appears normal
 Ventricles:            Appears normal         Ductal Arch:            Appears normal
 Choroid Plexus:        Appears normal         Diaphragm:              Appears normal
 Cerebellum:            Appears normal         Stomach:                Appears normal, left
                                                                       sided
 Posterior Fossa:       Appears normal         Abdomen:                Appears normal
 Nuchal Fold:           Previously seen        Abdominal Wall:         Appears nml (cord
                                                                       insert, abd wall)
 Face:                  Appears normal         Cord Vessels:           Appears normal (3
                        (orbits and profile)                           vessel cord)
 Lips:                  Appears normal         Kidneys:                Appear normal
 Palate:                Previously seen        Bladder:                Appears normal
 Thoracic:              Appears normal         Spine:                  Not well visualized
 Heart:                 Appears normal         Upper Extremities:      Previously seen
                        (4CH, axis, and
                        situs)
 RVOT:                  Appears normal         Lower Extremities:      Previously seen

 Other:  Heels and 5th digit visualized. Fetus appears to be a male. Open
         hands visualized. Technically difficult due to maternal habitus and
         fetal position.
Cervix Uterus Adnexa

 Cervix
 Length:           3.36  cm.
 Normal appearance by transabdominal scan.

 Left Ovary
 Simple cyst measuring 1.7cm.

 Right Ovary
 Within normal limits.
Impression

 Patient returned for completion of fetal anatomy. Fetal growth
 is appropriate for gestational age. Amniotic fluid is normal
 and good fetal activity is seen. Fetal spine could not be
 assessed because of position.

 Left ovarian cyst has almost resolved and measures 1.7 cm.
Recommendations

 An appointment was made for her to return in 4 weeks for
 fetal growth assessment (maternal obesity) and to assess
 spine.
                 Corbin, Isaiah

## 2021-09-11 ENCOUNTER — Encounter (HOSPITAL_COMMUNITY): Payer: Self-pay

## 2021-09-11 ENCOUNTER — Ambulatory Visit (HOSPITAL_COMMUNITY)
Admission: EM | Admit: 2021-09-11 | Discharge: 2021-09-11 | Disposition: A | Discharge status: Home / Self Care | Payer: Medicaid Other | Attending: Family Medicine | Admitting: Family Medicine

## 2021-09-11 ENCOUNTER — Ambulatory Visit (INDEPENDENT_AMBULATORY_CARE_PROVIDER_SITE_OTHER): Payer: Medicaid Other

## 2021-09-11 ENCOUNTER — Other Ambulatory Visit: Payer: Self-pay

## 2021-09-11 DIAGNOSIS — R079 Chest pain, unspecified: Secondary | ICD-10-CM

## 2021-09-11 DIAGNOSIS — J189 Pneumonia, unspecified organism: Secondary | ICD-10-CM

## 2021-09-11 DIAGNOSIS — R0600 Dyspnea, unspecified: Secondary | ICD-10-CM

## 2021-09-11 DIAGNOSIS — R509 Fever, unspecified: Secondary | ICD-10-CM | POA: Diagnosis not present

## 2021-09-11 MED ORDER — AMOXICILLIN-POT CLAVULANATE 875-125 MG PO TABS: 1.0000 | ORAL_TABLET | Freq: Two times a day (BID) | ORAL | 0 refills | Status: AC

## 2021-09-11 MED ORDER — AZITHROMYCIN 250 MG PO TABS: 250.0000 mg | ORAL_TABLET | Freq: Every day | ORAL | 0 refills | Status: AC

## 2021-09-11 NOTE — ED Notes (Signed)
Pt states has been asleep in her car for an hour with no air and in the heat.  ?

## 2021-09-11 NOTE — ED Triage Notes (Signed)
Pt c/o rt sided chest discomfort pressure when breathing since this am. Denies SOB but feels like her lungs aren't inflating. ?

## 2021-09-11 NOTE — Discharge Instructions (Signed)
Your x-ray showed pneumonia.  We are treating you for this.  Take the antibiotics as prescribed.  You can take over-the-counter medicines as needed for your symptoms ?

## 2021-09-11 NOTE — ED Provider Notes (Signed)
?[facility] ? ? ? ?CSN: [id number] ?Arrival date & time: 09/11/21  1258 ? ? ?  ? ?History   ?Chief Complaint ?Chief Complaint  ?Patient presents with  ? chest discomfort  ? ? ?HPI ?Kayla [name] is a 36 y.o. female.  ? ?36 year old female presents today with right-sided chest discomfort, pain with breathing, shortness of breath, fever.  Started this AM.  Has  had scratchy throat and mild cough. ?Has been taking TheraFlu.  Son has also had cold type symptoms. ? ? ? ?Past Medical History:  ?Diagnosis Date  ? Complication of anesthesia   ? epidural did not work well with CS  ? HSV (herpes simplex virus) infection   ? Medical history non-contributory   ? ? ?Patient Active Problem List  ? Diagnosis Date Noted  ? Post-dates pregnancy 03/22/2019  ? Anemia in pregnancy, unspecified trimester 08/24/2018  ? Supervision of low-risk pregnancy, unspecified trimester 08/24/2018  ? Obesity in pregnancy, antepartum 08/24/2018  ? 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 History of cesarean delivery, currently pregnant 12/02/2016  ? History of gestational hypertension 11/06/2016  ? ? ?Past Surgical History:  ?Procedure Laterality Date  ? CESAREAN SECTION N/A 11/06/2016  ? Procedure: CESAREAN SECTION;  Surgeon: Hermina Staggers, MD;  Location: [facility] BIRTHING SUITES;  Service: Obstetrics;  Laterality: N/A;  ? CESAREAN SECTION WITH BILATERAL TUBAL LIGATION N/A 03/23/2019  ? Procedure: CESAREAN SECTION WITH BILATERAL TUBAL LIGATION;  Surgeon: Levie Heritage, DO;  Location: [facility]S;  Service: Obstetrics;  Laterality: N/A;  ? ? ?OB History   ? ? Gravida  ?4  ? Para  ?2  ? Term  ?2  ? Preterm  ?   ? AB  ?2  ? Living  ?2  ?  ? ? SAB  ?   ? IAB  ?2  ? Ectopic  ?   ? Multiple  ?0  ? Live Births  ?2  ?   ?  ?  ? ? ? ?Home Medications   ? ?Prior to Admission medications   ?Medication Sig Start Date End Date Taking? Authorizing Provider  ?amoxicillin-clavulanate (AUGMENTIN) 875-125 MG tablet Take 1 tablet by mouth every 12 (twelve) hours for  5 days. 09/11/21 09/16/21 Yes Tavi Hoogendoorn A, NP  ?azithromycin (ZITHROMAX) 250 MG tablet Take 1 tablet (250 mg total) by mouth daily. Take first 2 tablets together, then 1 every day until finished. 09/11/21  Yes Jennett Tarbell A, NP  ?ferrous sulfate 325 (65 FE) MG tablet Take 1 tablet (325 mg total) by mouth 2 (two) times daily with a meal. ?Patient not taking: Reported on 04/06/2019 03/25/19   Arvilla Market, MD  ?ibuprofen (ADVIL) 800 MG tablet Take 1 tablet (800 mg total) by mouth every 8 (eight) hours as needed. 03/25/19   Arvilla Market, MD  ?oxyCODONE (OXY IR/ROXICODONE) 5 MG immediate release tablet Take 1-2 tablets (5-10 mg total) by mouth every 4 (four) hours as needed for moderate pain. ?Patient not taking: Reported on 04/06/2019 03/25/19   Arvilla Market, MD  ?senna-docusate (SENOKOT-S) 8.6-50 MG tablet Take 2 tablets by mouth daily. ?Patient not taking: Reported on 04/06/2019 03/25/19   Arvilla Market, MD  ? ? ?Family History ?Family History  ?Problem Relation Age of Onset  ? Sickle cell anemia Mother   ? Heart disease Father   ? Hypertension Father   ? Cancer Maternal Grandmother   ?     leukemia  ? Diabetes Maternal Grandmother   ?  Hyperlipidemia Maternal Grandfather   ? Heart disease Paternal Grandmother   ? ? ?Social History ?Social History  ? ?Tobacco Use  ? Smoking status: Former  ? Smokeless tobacco: Former  ?Vaping Use  ? Vaping Use: Never used  ?Substance Use Topics  ? Alcohol use: Not Currently  ?  Alcohol/week: 3.0 standard drinks  ?  Types: 3 Standard drinks or equivalent per week  ?  Comment: daily use-wine  ? Drug use: No  ? ? ? ?Allergies   ?Patient has no known allergies. ? ? ?Review of Systems ?Review of Systems  ?Constitutional:  Positive for fever.  ?Respiratory:  Positive for chest tightness and shortness of breath.   ? ? ?Physical Exam ?Triage Vital Signs ?ED Triage Vitals  ?Enc Vitals Group  ?   BP 09/11/21 1352 (!) 146/116  ?   Pulse Rate 09/11/21  1352 (!) 131  ?   Resp 09/11/21 1352 18  ?   Temp 09/11/21 1352 (!) 101.6 ?F (38.7 ?C)  ?   Temp Source 09/11/21 1352 Oral  ?   SpO2 09/11/21 1352 95 %  ?   Weight --   ?   Height --   ?   Head Circumference --   ?   Peak Flow --   ?   Pain Score 09/11/21 1353 7  ?   Pain Loc --   ?   Pain Edu? --   ?   Excl. in GC? --   ? ?No data found. ? ?Updated Vital Signs ?BP (!) 146/116 (BP Location: Left Arm)   Pulse (!) 131   Temp (!) 101.6 ?F (38.7 ?C) (Oral)   Resp 18   LMP 09/02/2021   SpO2 95%  ? ?Visual Acuity ?Right Eye Distance:   ?Left Eye Distance:   ?Bilateral Distance:   ? ?Right Eye Near:   ?Left Eye Near:    ?Bilateral Near:    ? ?Physical Exam ?Vitals and nursing note reviewed.  ?Constitutional:   ?   Appearance: Normal appearance. She is not ill-appearing or toxic-appearing.  ?HENT:  ?   Head: Normocephalic and atraumatic.  ?Cardiovascular:  ?   Rate and Rhythm: Tachycardia present.  ?   Pulses: Normal pulses.  ?   Heart sounds: Normal heart sounds.  ?Pulmonary:  ?   Effort: Pulmonary effort is normal.  ?   Breath sounds: Normal breath sounds.  ?Musculoskeletal:     ?   General: Normal range of motion.  ?Skin: ?   General: Skin is warm and dry.  ?Neurological:  ?   Mental Status: She is alert.  ?Psychiatric:     ?   Mood and Affect: Mood normal.  ? ? ? ?UC Treatments / Results  ?Labs ?(all labs ordered are listed, but only abnormal results are displayed) ?Labs Reviewed - No data to display ? ?EKG ? ? ?Radiology ?DG Chest 2 View ? ?Result Date: 09/11/2021 ?CLINICAL DATA:  Chest pain, dyspnea, fever EXAM: CHEST - 2 VIEW COMPARISON:  None. FINDINGS: Normal heart size. Normal mediastinal contour. No pneumothorax. No pleural effusion. Faint hazy right middle lobe opacity. IMPRESSION: Faint hazy right middle lobe opacity, cannot exclude a developing right middle lobe pneumonia. Follow-up chest radiograph suggested. Electronically Signed   By: Delbert PhenixJason A Poff M.D.   On: 09/11/2021 14:54   ? ?Procedures ?Procedures  (including critical care time) ? ?Medications Ordered in UC ?Medications - No data to display ? ?Initial Impression / Assessment and Plan / UC  Course  ?I have reviewed the triage vital signs and the nursing notes. ? ?Pertinent labs & imaging results that were available during my care of the patient were reviewed by me and considered in my medical decision making (see chart for details). ? ?Clinical Course as of 09/11/21 1510  ?Fri Sep 11, 2021  ?1451 DG Chest 2 View [TB]  ?  ?Clinical Course User Index ?[TB] Dahlia Byes A, NP  ? ? ?Community-acquired pneumonia ?Chest x-ray revealed right middle lobe pneumonia. ?Treating with dual therapy. ?Recommended medicines over-the-counter for symptoms, Tylenol, fluids ?Follow-up for any worsening symptoms ?Final Clinical Impressions(s) / UC Diagnoses  ? ?Final diagnoses:  ?Community acquired pneumonia of right middle lobe of lung  ? ? ? ?Discharge Instructions   ? ?  ?Your x-ray showed pneumonia.  We are treating you for this.  Take the antibiotics as prescribed.  You can take over-the-counter medicines as needed for your symptoms ? ? ? ? ?ED Prescriptions   ? ? Medication Sig Dispense Auth. Provider  ? amoxicillin-clavulanate (AUGMENTIN) 875-125 MG tablet Take 1 tablet by mouth every 12 (twelve) hours for 5 days. 10 tablet Chavela Justiniano A, NP  ? azithromycin (ZITHROMAX) 250 MG tablet Take 1 tablet (250 mg total) by mouth daily. Take first 2 tablets together, then 1 every day until finished. 6 tablet Dahlia Byes A, NP  ? ?  ? ?PDMP not reviewed this encounter. ?  ?Janace Aris, NP ?09/11/21 1511 ? ?

## 2023-05-13 ENCOUNTER — Ambulatory Visit
Admission: EM | Admit: 2023-05-13 | Discharge: 2023-05-13 | Disposition: A | Discharge status: Home / Self Care | Payer: Medicaid Other | Attending: Internal Medicine | Admitting: Internal Medicine

## 2023-05-13 DIAGNOSIS — R0789 Other chest pain: Secondary | ICD-10-CM

## 2023-05-13 DIAGNOSIS — F410 Panic disorder [episodic paroxysmal anxiety] without agoraphobia: Secondary | ICD-10-CM

## 2023-05-13 MED ORDER — HYDROXYZINE HCL 25 MG PO TABS: ORAL_TABLET | ORAL | 0 refills | Status: AC

## 2023-05-13 MED ORDER — SERTRALINE HCL 50 MG PO TABS: 50.0000 mg | ORAL_TABLET | Freq: Every day | ORAL | 0 refills | Status: AC

## 2023-05-13 NOTE — ED Triage Notes (Signed)
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

## 2023-05-13 NOTE — ED Provider Notes (Signed)
[facility]  Note:  This document was prepared using Conservation officer, historic buildings and may include unintentional dictation errors.  MRN: [id number] DOB: [birth year]  Subjective:   Kayla [name] is a 37 y.o. female presenting for concerns about anxiety and having panic attacks.  Earlier this week, patient was at home trying to prepare for the day when she suddenly felt a wave of chest pain, shaking, sense of being overwhelmed.  This episode lasted 30 minutes and was subsequently helped by her father who had concerns about her having a panic attack.  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  She has had to come in on an emergency basis where the previous teacher quit after a day.  Her students can be very challenging.  She also has significant stressors at home with her own children and trying to keep them healthy.  Has never previously had any difficulty with anxiety but also has not had such a difficult work position.  Denies history of chronic conditions that would elicit anxiety.  No thoughts about death, suicidal or homicidal ideation.  She is very open to medical therapy, behavioral therapy for her anxiety.  No current facility-administered medications for this encounter.  Current Outpatient Medications:    azithromycin (ZITHROMAX) 250 MG tablet, Take 1 tablet (250 mg total) by mouth daily. Take first 2 tablets together, then 1 every day until finished., Disp: 6 tablet, Rfl: 0   ferrous sulfate 325 (65 FE) MG tablet, Take 1 tablet (325 mg total) by mouth 2 (two) times daily with a meal. (Patient not taking: Reported on 04/06/2019), Disp: 60 tablet, Rfl: 3   ibuprofen (ADVIL) 800 MG tablet, Take 1 tablet (800 mg total) by mouth every 8 (eight) hours as needed., Disp: 60 tablet, Rfl: 1   oxyCODONE (OXY IR/ROXICODONE) 5 MG immediate release tablet, Take 1-2 tablets (5-10 mg total) by mouth every 4 (four) hours as needed  for moderate pain. (Patient not taking: Reported on 04/06/2019), Disp: 30 tablet, Rfl: 0   senna-docusate (SENOKOT-S) 8.6-50 MG tablet, Take 2 tablets by mouth daily. (Patient not taking: Reported on 04/06/2019), Disp: 20 tablet, Rfl: 0   No Known Allergies  Past Medical History:  Diagnosis Date   Complication of anesthesia    epidural did not work well with CS   HSV (herpes simplex virus) infection    Medical history non-contributory      Past Surgical History:  Procedure Laterality Date   CESAREAN SECTION N/A 11/06/2016   Procedure: CESAREAN SECTION;  Surgeon: Hermina Staggers, MD;  Location: [facility] BIRTHING SUITES;  Service: Obstetrics;  Laterality: N/A;   CESAREAN SECTION WITH BILATERAL TUBAL LIGATION N/A 03/23/2019   Procedure: CESAREAN SECTION WITH BILATERAL TUBAL LIGATION;  Surgeon: Levie Heritage, DO;  Location: [facility];  Service: Obstetrics;  Laterality: N/A;    Family History  Problem Relation Age of Onset   Sickle cell anemia Mother    Heart disease Father    Hypertension Father    Cancer Maternal Grandmother        leukemia   Diabetes Maternal Grandmother    Hyperlipidemia Maternal Grandfather    Heart disease Paternal Grandmother     Social History   Tobacco Use   Smoking status: Every Day    Types: Cigarettes   Smokeless tobacco: Never  Vaping Use   Vaping status: Never Used  Substance Use Topics   Alcohol use:  Yes    Comment: weekly   Drug use: No    ROS   Objective:   Vitals: BP (!) 143/87 (BP Location: Right Arm)   Pulse 95   Temp 98.7 F (37.1 C) (Oral)   Resp 20   LMP 04/20/2023   SpO2 96%   Physical Exam Constitutional:      General: She is not in acute distress.    Appearance: Normal appearance. She is well-developed. She is not ill-appearing, toxic-appearing or diaphoretic.  HENT:     Head: Normocephalic and atraumatic.     Right Ear: External ear normal.     Left Ear: External ear normal.     Nose: Nose normal.      Mouth/Throat:     Mouth: Mucous membranes are moist.  Eyes:     General: No scleral icterus.       Right eye: No discharge.        Left eye: No discharge.     Extraocular Movements: Extraocular movements intact.     Conjunctiva/sclera: Conjunctivae normal.  Cardiovascular:     Rate and Rhythm: Normal rate and regular rhythm.     Heart sounds: Normal heart sounds. No murmur heard.    No friction rub. No gallop.  Pulmonary:     Effort: Pulmonary effort is normal. No respiratory distress.     Breath sounds: No stridor. No wheezing, rhonchi or rales.  Chest:     Chest wall: No tenderness.  Skin:    General: Skin is warm and dry.  Neurological:     General: No focal deficit present.     Mental Status: She is alert and oriented to person, place, and time.  Psychiatric:        Attention and Perception: Attention and perception normal. She is attentive. She does not perceive auditory or visual hallucinations.        Mood and Affect: Mood and affect normal. Mood is not anxious, depressed or elated. Affect is not labile, blunt, flat, angry, tearful or inappropriate.        Speech: She is communicative. Speech is not rapid and pressured, delayed, slurred or tangential.        Behavior: Behavior normal. Behavior is not agitated, slowed, aggressive, withdrawn, hyperactive or combative.        Thought Content: Thought content normal. Thought content is not paranoid or delusional. Thought content does not include homicidal or suicidal ideation. Thought content does not include homicidal or suicidal plan.        Cognition and Memory: Cognition is not impaired. Memory is not impaired. She does not exhibit impaired recent memory or impaired remote memory.        Judgment: Judgment normal. Judgment is not impulsive or inappropriate.     Assessment and Plan :   PDMP not reviewed this encounter.  1. Atypical chest pain   2. Anxiety attack    Will pursue basic lab work.  Recommended starting  sertraline at a low dose of 50 mg once daily.  Offered hydroxyzine for breakthrough anxiety.  Recommended she follow-up with behavioral therapy, referral was placed.  Counseled patient on potential for adverse effects with medications prescribed/recommended today, ER and return-to-clinic precautions discussed, patient verbalized understanding.    Wallis Bamberg, PA-C 05/13/23 1013

## 2023-05-14 LAB — COMPREHENSIVE METABOLIC PANEL
ALT: 12 [IU]/L (ref 0–32)
AST: 21 [IU]/L (ref 0–40)
Albumin: 4.9 g/dL (ref 3.9–4.9)
Alkaline Phosphatase: 49 [IU]/L (ref 44–121)
BUN/Creatinine Ratio: 13 (ref 9–23)
BUN: 8 mg/dL (ref 6–20)
Bilirubin Total: 0.3 mg/dL (ref 0.0–1.2)
CO2: 24 mmol/L (ref 20–29)
Calcium: 9.5 mg/dL (ref 8.7–10.2)
Chloride: 100 mmol/L (ref 96–106)
Creatinine, Ser: 0.63 mg/dL (ref 0.57–1.00)
Globulin, Total: 2.2 g/dL (ref 1.5–4.5)
Glucose: 88 mg/dL (ref 70–99)
Potassium: 3.8 mmol/L (ref 3.5–5.2)
Sodium: 141 mmol/L (ref 134–144)
Total Protein: 7.1 g/dL (ref 6.0–8.5)
eGFR: 119 mL/min/{1.73_m2} (ref >59)

## 2023-05-14 LAB — CBC
Hematocrit: 42.2 % (ref 34.0–46.6)
Hemoglobin: 14.1 g/dL (ref 11.1–15.9)
MCH: 29 pg (ref 26.6–33.0)
MCHC: 33.4 g/dL (ref 31.5–35.7)
MCV: 87 fL (ref 79–97)
Platelets: 250 10*3/uL (ref 150–450)
RBC: 4.86 x10E6/uL (ref 3.77–5.28)
RDW: 13.9 % (ref 11.7–15.4)
WBC: 4.8 10*3/uL (ref 3.4–10.8)

## 2023-05-14 LAB — TSH: TSH: 1.6 u[IU]/mL (ref 0.450–4.500)
